# Patient Record
Sex: Female | Born: 1975
Health system: Southern US, Community
[De-identification: ages and names within clinical notes are randomized; demographics above are authoritative.]

## PROBLEM LIST (undated history)

## (undated) ENCOUNTER — Inpatient Hospital Stay (HOSPITAL_COMMUNITY): Payer: Self-pay

## (undated) DIAGNOSIS — Z8759 Personal history of other complications of pregnancy, childbirth and the puerperium: Secondary | ICD-10-CM

## (undated) DIAGNOSIS — E663 Overweight: Secondary | ICD-10-CM

## (undated) DIAGNOSIS — F418 Other specified anxiety disorders: Secondary | ICD-10-CM

## (undated) DIAGNOSIS — G4700 Insomnia, unspecified: Secondary | ICD-10-CM

## (undated) DIAGNOSIS — J069 Acute upper respiratory infection, unspecified: Secondary | ICD-10-CM

## (undated) DIAGNOSIS — R112 Nausea with vomiting, unspecified: Secondary | ICD-10-CM

## (undated) DIAGNOSIS — Z0000 Encounter for general adult medical examination without abnormal findings: Principal | ICD-10-CM

## (undated) DIAGNOSIS — Z8709 Personal history of other diseases of the respiratory system: Secondary | ICD-10-CM

## (undated) DIAGNOSIS — B351 Tinea unguium: Secondary | ICD-10-CM

## (undated) DIAGNOSIS — B019 Varicella without complication: Secondary | ICD-10-CM

## (undated) DIAGNOSIS — Z9889 Other specified postprocedural states: Secondary | ICD-10-CM

## (undated) DIAGNOSIS — T7840XA Allergy, unspecified, initial encounter: Secondary | ICD-10-CM

## (undated) DIAGNOSIS — Z8619 Personal history of other infectious and parasitic diseases: Secondary | ICD-10-CM

## (undated) DIAGNOSIS — K589 Irritable bowel syndrome without diarrhea: Secondary | ICD-10-CM

## (undated) HISTORY — DX: Personal history of other complications of pregnancy, childbirth and the puerperium: Z87.59

## (undated) HISTORY — DX: Irritable bowel syndrome without diarrhea: K58.9

## (undated) HISTORY — DX: Varicella without complication: B01.9

## (undated) HISTORY — DX: Allergy, unspecified, initial encounter: T78.40XA

## (undated) HISTORY — DX: Personal history of other diseases of the respiratory system: Z87.09

## (undated) HISTORY — DX: Other specified anxiety disorders: F41.8

## (undated) HISTORY — DX: Encounter for general adult medical examination without abnormal findings: Z00.00

## (undated) HISTORY — PX: CLEFT PALATE REPAIR: SUR1165

## (undated) HISTORY — DX: Tinea unguium: B35.1

## (undated) HISTORY — DX: Personal history of other infectious and parasitic diseases: Z86.19

## (undated) HISTORY — DX: Insomnia, unspecified: G47.00

## (undated) HISTORY — DX: Acute upper respiratory infection, unspecified: J06.9

## (undated) HISTORY — PX: OTHER SURGICAL HISTORY: SHX169

## (undated) HISTORY — DX: Overweight: E66.3

---

## 2003-08-08 ENCOUNTER — Other Ambulatory Visit: Admission: RE | Admit: 2003-08-08 | Discharge: 2003-08-08 | Payer: Self-pay | Admitting: Obstetrics & Gynecology

## 2003-11-07 ENCOUNTER — Encounter: Admission: RE | Admit: 2003-11-07 | Discharge: 2003-11-07 | Payer: Self-pay | Admitting: Family Medicine

## 2004-04-07 ENCOUNTER — Emergency Department (HOSPITAL_COMMUNITY): Admission: EM | Admit: 2004-04-07 | Discharge: 2004-04-07 | Payer: Self-pay | Admitting: Emergency Medicine

## 2004-10-30 ENCOUNTER — Ambulatory Visit: Payer: Self-pay | Admitting: Sports Medicine

## 2006-10-19 ENCOUNTER — Inpatient Hospital Stay (HOSPITAL_COMMUNITY): Admission: AD | Admit: 2006-10-19 | Discharge: 2006-10-22 | Payer: Self-pay | Admitting: Obstetrics & Gynecology

## 2006-11-03 ENCOUNTER — Ambulatory Visit: Admission: RE | Admit: 2006-11-03 | Discharge: 2006-11-03 | Payer: Self-pay | Admitting: Obstetrics & Gynecology

## 2006-11-17 HISTORY — PX: MANDIBLE FRACTURE SURGERY: SHX706

## 2007-12-02 ENCOUNTER — Emergency Department (HOSPITAL_COMMUNITY): Admission: EM | Admit: 2007-12-02 | Discharge: 2007-12-02 | Payer: Self-pay | Admitting: Family Medicine

## 2008-09-14 ENCOUNTER — Inpatient Hospital Stay (HOSPITAL_COMMUNITY): Admission: AD | Admit: 2008-09-14 | Discharge: 2008-09-14 | Payer: Self-pay | Admitting: Obstetrics

## 2008-09-18 ENCOUNTER — Inpatient Hospital Stay (HOSPITAL_COMMUNITY): Admission: AD | Admit: 2008-09-18 | Discharge: 2008-09-19 | Payer: Self-pay | Admitting: Obstetrics

## 2010-01-20 ENCOUNTER — Emergency Department (HOSPITAL_COMMUNITY)
Admission: EM | Admit: 2010-01-20 | Discharge: 2010-01-20 | Payer: Self-pay | Source: Home / Self Care | Admitting: Emergency Medicine

## 2010-09-02 ENCOUNTER — Encounter
Admission: RE | Admit: 2010-09-02 | Discharge: 2010-12-01 | Payer: Self-pay | Source: Home / Self Care | Attending: Obstetrics & Gynecology | Admitting: Obstetrics & Gynecology

## 2010-11-05 ENCOUNTER — Inpatient Hospital Stay (HOSPITAL_COMMUNITY)
Admission: RE | Admit: 2010-11-05 | Discharge: 2010-11-07 | Payer: Self-pay | Source: Home / Self Care | Attending: Obstetrics & Gynecology | Admitting: Obstetrics & Gynecology

## 2010-11-17 LAB — HM PAP SMEAR: HM Pap smear: NORMAL

## 2011-01-27 LAB — CBC
HCT: 35.3 % — ABNORMAL LOW (ref 36.0–46.0)
HCT: 38.8 % (ref 36.0–46.0)
Hemoglobin: 11.6 g/dL — ABNORMAL LOW (ref 12.0–15.0)
Hemoglobin: 13 g/dL (ref 12.0–15.0)
MCH: 30.1 pg (ref 26.0–34.0)
MCH: 30.4 pg (ref 26.0–34.0)
MCHC: 32.9 g/dL (ref 30.0–36.0)
MCHC: 33.5 g/dL (ref 30.0–36.0)
MCV: 90.9 fL (ref 78.0–100.0)
MCV: 91.5 fL (ref 78.0–100.0)
Platelets: 169 10*3/uL (ref 150–400)
Platelets: 201 10*3/uL (ref 150–400)
RBC: 3.86 MIL/uL — ABNORMAL LOW (ref 3.87–5.11)
RBC: 4.27 MIL/uL (ref 3.87–5.11)
RDW: 13.9 % (ref 11.5–15.5)
RDW: 14.1 % (ref 11.5–15.5)
WBC: 12.8 10*3/uL — ABNORMAL HIGH (ref 4.0–10.5)
WBC: 9 10*3/uL (ref 4.0–10.5)

## 2011-01-27 LAB — ABO/RH: ABO/RH(D): O POS

## 2011-01-27 LAB — RPR: RPR Ser Ql: NONREACTIVE

## 2011-02-09 LAB — POCT RAPID STREP A (OFFICE): Streptococcus, Group A Screen (Direct): NEGATIVE

## 2011-08-08 LAB — POCT RAPID STREP A: Streptococcus, Group A Screen (Direct): NEGATIVE

## 2011-08-19 LAB — CBC
HCT: 34.4 — ABNORMAL LOW
HCT: 42.6
Hemoglobin: 11.5 — ABNORMAL LOW
Hemoglobin: 14.1
MCHC: 33.2
MCHC: 33.4
MCV: 91.1
MCV: 92
Platelets: 229
Platelets: 268
RBC: 3.74 — ABNORMAL LOW
RBC: 4.68
RDW: 13.9
RDW: 13.9
WBC: 12.6 — ABNORMAL HIGH
WBC: 13.8 — ABNORMAL HIGH

## 2011-08-19 LAB — CCBB MATERNAL DONOR DRAW

## 2011-08-19 LAB — RPR: RPR Ser Ql: NONREACTIVE

## 2012-02-02 ENCOUNTER — Ambulatory Visit (INDEPENDENT_AMBULATORY_CARE_PROVIDER_SITE_OTHER): Payer: 59 | Admitting: Family Medicine

## 2012-02-02 ENCOUNTER — Encounter: Payer: Self-pay | Admitting: Family Medicine

## 2012-02-02 VITALS — BP 108/74 | HR 105 | Temp 98.2°F | Ht 63.75 in | Wt 138.4 lb

## 2012-02-02 DIAGNOSIS — Z309 Encounter for contraceptive management, unspecified: Secondary | ICD-10-CM

## 2012-02-02 DIAGNOSIS — Z0000 Encounter for general adult medical examination without abnormal findings: Secondary | ICD-10-CM | POA: Insufficient documentation

## 2012-02-02 DIAGNOSIS — G4700 Insomnia, unspecified: Secondary | ICD-10-CM

## 2012-02-02 DIAGNOSIS — R32 Unspecified urinary incontinence: Secondary | ICD-10-CM | POA: Insufficient documentation

## 2012-02-02 DIAGNOSIS — T7840XA Allergy, unspecified, initial encounter: Secondary | ICD-10-CM

## 2012-02-02 DIAGNOSIS — Z8619 Personal history of other infectious and parasitic diseases: Secondary | ICD-10-CM | POA: Insufficient documentation

## 2012-02-02 DIAGNOSIS — Z8709 Personal history of other diseases of the respiratory system: Secondary | ICD-10-CM | POA: Insufficient documentation

## 2012-02-02 DIAGNOSIS — K589 Irritable bowel syndrome without diarrhea: Secondary | ICD-10-CM

## 2012-02-02 HISTORY — DX: Irritable bowel syndrome, unspecified: K58.9

## 2012-02-02 HISTORY — DX: Encounter for general adult medical examination without abnormal findings: Z00.00

## 2012-02-02 HISTORY — DX: Insomnia, unspecified: G47.00

## 2012-02-02 MED ORDER — LEVONORGESTREL 20 MCG/24HR IU IUD: 1.0000 | INTRAUTERINE_SYSTEM | Freq: Once | INTRAUTERINE | Status: DC

## 2012-02-02 NOTE — Assessment & Plan Note (Signed)
Encouraged Kegel exercises bid, call if worsens or does not improve

## 2012-02-02 NOTE — Assessment & Plan Note (Signed)
Given advice on good sleep hygiene, avoid blue lights, cool, dark room. May use Diphenhydramine prn

## 2012-02-02 NOTE — Patient Instructions (Signed)
Preventive Care for Adults, Female A healthy lifestyle and preventive care can promote health and wellness. Preventive health guidelines for women include the following key practices.  A routine yearly physical is a good way to check with your caregiver about your health and preventive screening. It is a chance to share any concerns and updates on your health, and to receive a thorough exam.   Visit your dentist for a routine exam and preventive care every 6 months. Brush your teeth twice a day and floss once a day. Good oral hygiene prevents tooth decay and gum disease.   The frequency of eye exams is based on your age, health, family medical history, use of contact lenses, and other factors. Follow your caregiver's recommendations for frequency of eye exams.   Eat a healthy diet. Foods like vegetables, fruits, whole grains, low-fat dairy products, and lean protein foods contain the nutrients you need without too many calories. Decrease your intake of foods high in solid fats, added sugars, and salt. Eat the right amount of calories for you.Get information about a proper diet from your caregiver, if necessary.   Regular physical exercise is one of the most important things you can do for your health. Most adults should get at least 150 minutes of moderate-intensity exercise (any activity that increases your heart rate and causes you to sweat) each week. In addition, most adults need muscle-strengthening exercises on 2 or more days a week.   Maintain a healthy weight. The body mass index (BMI) is a screening tool to identify possible weight problems. It provides an estimate of body fat based on height and weight. Your caregiver can help determine your BMI, and can help you achieve or maintain a healthy weight.For adults 20 years and older:   A BMI below 18.5 is considered underweight.   A BMI of 18.5 to 24.9 is normal.   A BMI of 25 to 29.9 is considered overweight.   A BMI of 30 and above is  considered obese.   Maintain normal blood lipids and cholesterol levels by exercising and minimizing your intake of saturated fat. Eat a balanced diet with plenty of fruit and vegetables. Blood tests for lipids and cholesterol should begin at age 20 and be repeated every 5 years. If your lipid or cholesterol levels are high, you are over 50, or you are at high risk for heart disease, you may need your cholesterol levels checked more frequently.Ongoing high lipid and cholesterol levels should be treated with medicines if diet and exercise are not effective.   If you smoke, find out from your caregiver how to quit. If you do not use tobacco, do not start.   If you are pregnant, do not drink alcohol. If you are breastfeeding, be very cautious about drinking alcohol. If you are not pregnant and choose to drink alcohol, do not exceed 1 drink per day. One drink is considered to be 12 ounces (355 mL) of beer, 5 ounces (148 mL) of wine, or 1.5 ounces (44 mL) of liquor.   Avoid use of street drugs. Do not share needles with anyone. Ask for help if you need support or instructions about stopping the use of drugs.   High blood pressure causes heart disease and increases the risk of stroke. Your blood pressure should be checked at least every 1 to 2 years. Ongoing high blood pressure should be treated with medicines if weight loss and exercise are not effective.   If you are 55 to 36   years old, ask your caregiver if you should take aspirin to prevent strokes.   Diabetes screening involves taking a blood sample to check your fasting blood sugar level. This should be done once every 3 years, after age 45, if you are within normal weight and without risk factors for diabetes. Testing should be considered at a younger age or be carried out more frequently if you are overweight and have at least 1 risk factor for diabetes.   Breast cancer screening is essential preventive care for women. You should practice "breast  self-awareness." This means understanding the normal appearance and feel of your breasts and may include breast self-examination. Any changes detected, no matter how small, should be reported to a caregiver. Women in their 20s and 30s should have a clinical breast exam (CBE) by a caregiver as part of a regular health exam every 1 to 3 years. After age 40, women should have a CBE every year. Starting at age 40, women should consider having a mammography (breast X-ray test) every year. Women who have a family history of breast cancer should talk to their caregiver about genetic screening. Women at a high risk of breast cancer should talk to their caregivers about having magnetic resonance imaging (MRI) and a mammography every year.   The Pap test is a screening test for cervical cancer. A Pap test can show cell changes on the cervix that might become cervical cancer if left untreated. A Pap test is a procedure in which cells are obtained and examined from the lower end of the uterus (cervix).   Women should have a Pap test starting at age 21.   Between ages 21 and 29, Pap tests should be repeated every 2 years.   Beginning at age 30, you should have a Pap test every 3 years as long as the past 3 Pap tests have been normal.   Some women have medical problems that increase the chance of getting cervical cancer. Talk to your caregiver about these problems. It is especially important to talk to your caregiver if a new problem develops soon after your last Pap test. In these cases, your caregiver may recommend more frequent screening and Pap tests.   The above recommendations are the same for women who have or have not gotten the vaccine for human papillomavirus (HPV).   If you had a hysterectomy for a problem that was not cancer or a condition that could lead to cancer, then you no longer need Pap tests. Even if you no longer need a Pap test, a regular exam is a good idea to make sure no other problems are  starting.   If you are between ages 65 and 70, and you have had normal Pap tests going back 10 years, you no longer need Pap tests. Even if you no longer need a Pap test, a regular exam is a good idea to make sure no other problems are starting.   If you have had past treatment for cervical cancer or a condition that could lead to cancer, you need Pap tests and screening for cancer for at least 20 years after your treatment.   If Pap tests have been discontinued, risk factors (such as a new sexual partner) need to be reassessed to determine if screening should be resumed.   The HPV test is an additional test that may be used for cervical cancer screening. The HPV test looks for the virus that can cause the cell changes on the cervix.   The cells collected during the Pap test can be tested for HPV. The HPV test could be used to screen women aged 30 years and older, and should be used in women of any age who have unclear Pap test results. After the age of 30, women should have HPV testing at the same frequency as a Pap test.   Colorectal cancer can be detected and often prevented. Most routine colorectal cancer screening begins at the age of 50 and continues through age 75. However, your caregiver may recommend screening at an earlier age if you have risk factors for colon cancer. On a yearly basis, your caregiver may provide home test kits to check for hidden blood in the stool. Use of a small camera at the end of a tube, to directly examine the colon (sigmoidoscopy or colonoscopy), can detect the earliest forms of colorectal cancer. Talk to your caregiver about this at age 50, when routine screening begins. Direct examination of the colon should be repeated every 5 to 10 years through age 75, unless early forms of pre-cancerous polyps or small growths are found.   Hepatitis C blood testing is recommended for all people born from 1945 through 1965 and any individual with known risks for hepatitis C.    Practice safe sex. Use condoms and avoid high-risk sexual practices to reduce the spread of sexually transmitted infections (STIs). STIs include gonorrhea, chlamydia, syphilis, trichomonas, herpes, HPV, and human immunodeficiency virus (HIV). Herpes, HIV, and HPV are viral illnesses that have no cure. They can result in disability, cancer, and death. Sexually active women aged 25 and younger should be checked for chlamydia. Older women with new or multiple partners should also be tested for chlamydia. Testing for other STIs is recommended if you are sexually active and at increased risk.   Osteoporosis is a disease in which the bones lose minerals and strength with aging. This can result in serious bone fractures. The risk of osteoporosis can be identified using a bone density scan. Women ages 65 and over and women at risk for fractures or osteoporosis should discuss screening with their caregivers. Ask your caregiver whether you should take a calcium supplement or vitamin D to reduce the rate of osteoporosis.   Menopause can be associated with physical symptoms and risks. Hormone replacement therapy is available to decrease symptoms and risks. You should talk to your caregiver about whether hormone replacement therapy is right for you.   Use sunscreen with sun protection factor (SPF) of 30 or more. Apply sunscreen liberally and repeatedly throughout the day. You should seek shade when your shadow is shorter than you. Protect yourself by wearing long sleeves, pants, a wide-brimmed hat, and sunglasses year round, whenever you are outdoors.   Once a month, do a whole body skin exam, using a mirror to look at the skin on your back. Notify your caregiver of new moles, moles that have irregular borders, moles that are larger than a pencil eraser, or moles that have changed in shape or color.   Stay current with required immunizations.   Influenza. You need a dose every fall (or winter). The composition of  the flu vaccine changes each year, so being vaccinated once is not enough.   Pneumococcal polysaccharide. You need 1 to 2 doses if you smoke cigarettes or if you have certain chronic medical conditions. You need 1 dose at age 65 (or older) if you have never been vaccinated.   Tetanus, diphtheria, pertussis (Tdap, Td). Get 1 dose of   Tdap vaccine if you are younger than age 65, are over 65 and have contact with an infant, are a healthcare worker, are pregnant, or simply want to be protected from whooping cough. After that, you need a Td booster dose every 10 years. Consult your caregiver if you have not had at least 3 tetanus and diphtheria-containing shots sometime in your life or have a deep or dirty wound.   HPV. You need this vaccine if you are a woman age 26 or younger. The vaccine is given in 3 doses over 6 months.   Measles, mumps, rubella (MMR). You need at least 1 dose of MMR if you were born in 1957 or later. You may also need a second dose.   Meningococcal. If you are age 19 to 21 and a first-year college student living in a residence hall, or have one of several medical conditions, you need to get vaccinated against meningococcal disease. You may also need additional booster doses.   Zoster (shingles). If you are age 60 or older, you should get this vaccine.   Varicella (chickenpox). If you have never had chickenpox or you were vaccinated but received only 1 dose, talk to your caregiver to find out if you need this vaccine.   Hepatitis A. You need this vaccine if you have a specific risk factor for hepatitis A virus infection or you simply wish to be protected from this disease. The vaccine is usually given as 2 doses, 6 to 18 months apart.   Hepatitis B. You need this vaccine if you have a specific risk factor for hepatitis B virus infection or you simply wish to be protected from this disease. The vaccine is given in 3 doses, usually over 6 months.  Preventive Services /  Frequency Ages 19 to 39  Blood pressure check.** / Every 1 to 2 years.   Lipid and cholesterol check.** / Every 5 years beginning at age 20.   Clinical breast exam.** / Every 3 years for women in their 20s and 30s.   Pap test.** / Every 2 years from ages 21 through 29. Every 3 years starting at age 30 through age 65 or 70 with a history of 3 consecutive normal Pap tests.   HPV screening.** / Every 3 years from ages 30 through ages 65 to 70 with a history of 3 consecutive normal Pap tests.   Hepatitis C blood test.** / For any individual with known risks for hepatitis C.   Skin self-exam. / Monthly.   Influenza immunization.** / Every year.   Pneumococcal polysaccharide immunization.** / 1 to 2 doses if you smoke cigarettes or if you have certain chronic medical conditions.   Tetanus, diphtheria, pertussis (Tdap, Td) immunization. / A one-time dose of Tdap vaccine. After that, you need a Td booster dose every 10 years.   HPV immunization. / 3 doses over 6 months, if you are 26 and younger.   Measles, mumps, rubella (MMR) immunization. / You need at least 1 dose of MMR if you were born in 1957 or later. You may also need a second dose.   Meningococcal immunization. / 1 dose if you are age 19 to 21 and a first-year college student living in a residence hall, or have one of several medical conditions, you need to get vaccinated against meningococcal disease. You may also need additional booster doses.   Varicella immunization.** / Consult your caregiver.   Hepatitis A immunization.** / Consult your caregiver. 2 doses, 6 to 18 months   apart.   Hepatitis B immunization.** / Consult your caregiver. 3 doses usually over 6 months.  Ages 40 to 64  Blood pressure check.** / Every 1 to 2 years.   Lipid and cholesterol check.** / Every 5 years beginning at age 20.   Clinical breast exam.** / Every year after age 40.   Mammogram.** / Every year beginning at age 40 and continuing for as  long as you are in good health. Consult with your caregiver.   Pap test.** / Every 3 years starting at age 30 through age 65 or 70 with a history of 3 consecutive normal Pap tests.   HPV screening.** / Every 3 years from ages 30 through ages 65 to 70 with a history of 3 consecutive normal Pap tests.   Fecal occult blood test (FOBT) of stool. / Every year beginning at age 50 and continuing until age 75. You may not need to do this test if you get a colonoscopy every 10 years.   Flexible sigmoidoscopy or colonoscopy.** / Every 5 years for a flexible sigmoidoscopy or every 10 years for a colonoscopy beginning at age 50 and continuing until age 75.   Hepatitis C blood test.** / For all people born from 1945 through 1965 and any individual with known risks for hepatitis C.   Skin self-exam. / Monthly.   Influenza immunization.** / Every year.   Pneumococcal polysaccharide immunization.** / 1 to 2 doses if you smoke cigarettes or if you have certain chronic medical conditions.   Tetanus, diphtheria, pertussis (Tdap, Td) immunization.** / A one-time dose of Tdap vaccine. After that, you need a Td booster dose every 10 years.   Measles, mumps, rubella (MMR) immunization. / You need at least 1 dose of MMR if you were born in 1957 or later. You may also need a second dose.   Varicella immunization.** / Consult your caregiver.   Meningococcal immunization.** / Consult your caregiver.   Hepatitis A immunization.** / Consult your caregiver. 2 doses, 6 to 18 months apart.   Hepatitis B immunization.** / Consult your caregiver. 3 doses, usually over 6 months.  Ages 65 and over  Blood pressure check.** / Every 1 to 2 years.   Lipid and cholesterol check.** / Every 5 years beginning at age 20.   Clinical breast exam.** / Every year after age 40.   Mammogram.** / Every year beginning at age 40 and continuing for as long as you are in good health. Consult with your caregiver.   Pap test.** /  Every 3 years starting at age 30 through age 65 or 70 with a 3 consecutive normal Pap tests. Testing can be stopped between 65 and 70 with 3 consecutive normal Pap tests and no abnormal Pap or HPV tests in the past 10 years.   HPV screening.** / Every 3 years from ages 30 through ages 65 or 70 with a history of 3 consecutive normal Pap tests. Testing can be stopped between 65 and 70 with 3 consecutive normal Pap tests and no abnormal Pap or HPV tests in the past 10 years.   Fecal occult blood test (FOBT) of stool. / Every year beginning at age 50 and continuing until age 75. You may not need to do this test if you get a colonoscopy every 10 years.   Flexible sigmoidoscopy or colonoscopy.** / Every 5 years for a flexible sigmoidoscopy or every 10 years for a colonoscopy beginning at age 50 and continuing until age 75.   Hepatitis   C blood test.** / For all people born from [birth year] through 1965 and any individual with known risks for hepatitis C.   Osteoporosis screening.** / A one-time screening for women ages 52 and over and women at risk for fractures or osteoporosis.   Skin self-exam. / Monthly.   Influenza immunization.** / Every year.   Pneumococcal polysaccharide immunization.** / 1 dose at age 65 (or older) if you have never been vaccinated.   Tetanus, diphtheria, pertussis (Tdap, Td) immunization. / A one-time dose of Tdap vaccine if you are over 65 and have contact with an infant, are a Research scientist (physical sciences), or simply want to be protected from whooping cough. After that, you need a Td booster dose every 10 years.   Varicella immunization.** / Consult your caregiver.   Meningococcal immunization.** / Consult your caregiver.   Hepatitis A immunization.** / Consult your caregiver. 2 doses, 6 to 18 months apart.   Hepatitis B immunization.** / Check with your caregiver. 3 doses, usually over 6 months.  ** Family history and personal history of risk and conditions may change your caregiver's  recommendations. Document Released: 12/30/2001 Document Revised: 10/23/2011 Document Reviewed: 03/31/2011 [facility] Patient Information 2012 [place], [place].  Start a probiotic such as Librarian, academic daily and a yogurt daily, such as [employer] yogurts  Consider a fiber supplement daily as well MegaRed Krill oil caps by [employer] 1 daily is good for cholesterol but many other things such as Ne[employer] Health

## 2012-02-02 NOTE — Progress Notes (Signed)
Patient ID: RESA RINKS, female   DOB: June 10, 1976, 36 y.o.   MRN: [id number] IDALIZ TINKLE [id number] 01/04/1976 02/02/2012      Progress Note New Patient  Subjective  Chief Complaint  Chief Complaint  Patient presents with  . Establish Care    new patient    HPI  This is a 36 year old Caucasian female who is in today for new patient appointment. Historically her biggest health concerns are all related. She has shoulder with alternate diarrhea and constipation off and on for years. She was seen by gastroenterologist about 2 years ago and diagnosed with irritable bowel syndrome. Was given a prescription for Bentyl but never took it secondary to becoming pregnant. She moves her bowels comfortably once to twice daily but occasionally has days of 6 use of multiple stools daily. No bloody or tarry stool. No urinary complaints. He does not take anything for these symptoms except for an occasional half an Imodium. Sometimes when she does that she'll in a constipated for a few days so she tries to avoid doing this. She otherwise feels well today. Does occasionally have trouble falling asleep but does have 3 young children at home. The child had trouble with sinus infections but these have not bothered her and as an adult. They've improved since she had septum repair. She does occasionally get itchy runny nose and believes she still has low-grade seasonal allergies but finds the symptoms tolerable. At present she still follows with OB/GYN and is considering a fourth pregnancy.  Past Medical History  Diagnosis Date  . Chicken pox as a child  . Vaginal delivery     X 3  . H/O sinusitis     improved s/p surgical repair of deviated septum  . Urinary incontinence     s/p 3rd vaginal delivery, occasional  . Preventative health care 02/02/2012  . Insomnia 02/02/2012  . IBS (irritable bowel syndrome) 02/02/2012  . Allergy     seasonal    Past Surgical History  Procedure Date  . Mandible  fracture surgery 2008  . Deviated septum repair teenager  . Cleft palate repair as a child  . Skin biopsy     multiple all benign    Family History  Problem Relation Age of Onset  . Hypertension Mother   . Hyperlipidemia Mother   . Diabetes Father     type 2  . Stroke Maternal Grandmother     few  . Cancer Maternal Grandfather     stomach  . Heart attack Maternal Grandfather     few  . Cancer Paternal Grandfather     lung- nonsmoker    History   Social History  . Marital Status: Married    Spouse Name: N/A    Number of Children: N/A  . Years of Education: N/A   Occupational History  . Not on file.   Social History Main Topics  . Smoking status: Never Smoker   . Smokeless tobacco: Never Used  . Alcohol Use: Yes     socially  . Drug Use: No  . Sexually Active: Yes -- Female partner(s)   Other Topics Concern  . Not on file   Social History Narrative  . No narrative on file    No current outpatient prescriptions on file prior to visit.    No Known Allergies  Review of Systems  Review of Systems  Constitutional: Negative for fever, chills and malaise/fatigue.  HENT: Negative for hearing loss, nosebleeds and congestion.  Eyes: Negative for discharge.  Respiratory: Negative for cough, sputum production, shortness of breath and wheezing.   Cardiovascular: Negative for chest pain, palpitations and leg swelling.  Gastrointestinal: Positive for diarrhea. Negative for heartburn, nausea, vomiting, abdominal pain, constipation and blood in stool.  Genitourinary: Negative for dysuria, urgency, frequency and hematuria.  Musculoskeletal: Negative for myalgias, back pain and falls.  Skin: Negative for rash.  Neurological: Negative for dizziness, tremors, sensory change, focal weakness, loss of consciousness, weakness and headaches.  Endo/Heme/Allergies: Negative for polydipsia. Does not bruise/bleed easily.  Psychiatric/Behavioral: Negative for depression and suicidal  ideas. The patient has insomnia. The patient is not nervous/anxious.     Objective  BP 108/74  Pulse 105  Temp(Src) 98.2 F (36.8 C) (Temporal)  Ht 5' 3.75" (1.619 m)  Wt 138 lb 6.4 oz (62.778 kg)  BMI 23.94 kg/m2  SpO2 97%  Physical Exam  Physical Exam  Constitutional: She is oriented to person, place, and time and well-developed, well-nourished, and in no distress. No distress.  HENT:  Head: Normocephalic and atraumatic.  Right Ear: External ear normal.  Left Ear: External ear normal.  Nose: Nose normal.  Mouth/Throat: Oropharynx is clear and moist. No oropharyngeal exudate.  Eyes: Conjunctivae are normal. Pupils are equal, round, and reactive to light. Right eye exhibits no discharge. Left eye exhibits no discharge. No scleral icterus.  Neck: Normal range of motion. Neck supple. No thyromegaly present.  Cardiovascular: Normal rate, regular rhythm, normal heart sounds and intact distal pulses.   No murmur heard. Pulmonary/Chest: Effort normal and breath sounds normal. No respiratory distress. She has no wheezes. She has no rales.  Abdominal: Soft. Bowel sounds are normal. She exhibits no distension and no mass. There is no tenderness.  Musculoskeletal: Normal range of motion. She exhibits no edema and no tenderness.  Lymphadenopathy:    She has no cervical adenopathy.  Neurological: She is alert and oriented to person, place, and time. She has normal reflexes. No cranial nerve deficit. Coordination normal.  Skin: Skin is warm and dry. No rash noted. She is not diaphoretic.  Psychiatric: Mood, memory and affect normal.       Assessment & Plan  Preventative health care Patient encouraged to eat a heart healthy diet, consider the DASH diet with decreased processed food. Needs 8 hours sleep and hi fiber foods. Is going to have her fasting labs done with her OB/GYN and forwarded to [place] this year.  Urinary incontinence Encouraged Kegel exercises bid, call if worsens or does  not improve  Insomnia Given advice on good sleep hygiene, avoid blue lights, cool, dark room. May use Diphenhydramine prn  IBS (irritable bowel syndrome) Encouraged to avoid processed food, add a fiber supplement, add a probiotic and a yogurt daily. Notify us if symptoms worsen or do not resolve. Would consider Bentyl or just Hyoscyamine prn for discomfort if symptoms worsen  Allergic state Mild, does not usually take meds, may consider Zyrtec prn.

## 2012-02-02 NOTE — Assessment & Plan Note (Addendum)
Patient encouraged to eat a heart healthy diet, consider the DASH diet with decreased processed food. Needs 8 hours sleep and hi fiber foods. Is going to have her fasting labs done with her OB/GYN and forwarded to [place] this year.

## 2012-02-02 NOTE — Assessment & Plan Note (Signed)
Encouraged to avoid processed food, add a fiber supplement, add a probiotic and a yogurt daily. Notify us if symptoms worsen or do not resolve. Would consider Bentyl or just Hyoscyamine prn for discomfort if symptoms worsen

## 2012-02-02 NOTE — Assessment & Plan Note (Signed)
Mild, does not usually take meds, may consider Zyrtec prn.

## 2012-10-11 ENCOUNTER — Ambulatory Visit: Payer: 59 | Attending: General Practice | Admitting: Physical Therapy

## 2012-10-11 DIAGNOSIS — IMO0001 Reserved for inherently not codable concepts without codable children: Secondary | ICD-10-CM | POA: Insufficient documentation

## 2012-10-11 DIAGNOSIS — M2550 Pain in unspecified joint: Secondary | ICD-10-CM | POA: Insufficient documentation

## 2012-10-11 DIAGNOSIS — M2569 Stiffness of other specified joint, not elsewhere classified: Secondary | ICD-10-CM | POA: Insufficient documentation

## 2012-10-19 ENCOUNTER — Ambulatory Visit: Payer: 59 | Attending: General Practice | Admitting: Physical Therapy

## 2012-10-19 DIAGNOSIS — IMO0001 Reserved for inherently not codable concepts without codable children: Secondary | ICD-10-CM | POA: Insufficient documentation

## 2012-10-19 DIAGNOSIS — M2550 Pain in unspecified joint: Secondary | ICD-10-CM | POA: Insufficient documentation

## 2012-10-19 DIAGNOSIS — M2569 Stiffness of other specified joint, not elsewhere classified: Secondary | ICD-10-CM | POA: Insufficient documentation

## 2012-10-22 ENCOUNTER — Ambulatory Visit: Payer: 59 | Admitting: Physical Therapy

## 2012-10-26 ENCOUNTER — Ambulatory Visit: Payer: 59 | Admitting: Physical Therapy

## 2012-10-29 ENCOUNTER — Ambulatory Visit: Payer: 59 | Admitting: Physical Therapy

## 2012-11-02 ENCOUNTER — Ambulatory Visit: Payer: 59 | Admitting: Physical Therapy

## 2012-11-05 ENCOUNTER — Ambulatory Visit: Payer: 59 | Admitting: Physical Therapy

## 2012-11-09 ENCOUNTER — Ambulatory Visit: Payer: 59 | Admitting: Physical Therapy

## 2012-11-12 ENCOUNTER — Ambulatory Visit: Payer: 59 | Admitting: Physical Therapy

## 2012-11-15 ENCOUNTER — Ambulatory Visit: Payer: 59 | Admitting: Physical Therapy

## 2012-11-22 ENCOUNTER — Ambulatory Visit: Payer: 59 | Attending: General Practice | Admitting: Physical Therapy

## 2012-11-22 DIAGNOSIS — M2550 Pain in unspecified joint: Secondary | ICD-10-CM | POA: Insufficient documentation

## 2012-11-22 DIAGNOSIS — M26629 Arthralgia of temporomandibular joint, unspecified side: Secondary | ICD-10-CM | POA: Insufficient documentation

## 2012-11-22 DIAGNOSIS — IMO0001 Reserved for inherently not codable concepts without codable children: Secondary | ICD-10-CM | POA: Insufficient documentation

## 2012-11-22 DIAGNOSIS — M2569 Stiffness of other specified joint, not elsewhere classified: Secondary | ICD-10-CM | POA: Insufficient documentation

## 2012-11-30 ENCOUNTER — Ambulatory Visit: Payer: 59 | Admitting: Physical Therapy

## 2012-12-06 ENCOUNTER — Ambulatory Visit: Payer: 59 | Admitting: Physical Therapy

## 2013-01-30 ENCOUNTER — Inpatient Hospital Stay (HOSPITAL_COMMUNITY): Payer: 59

## 2013-01-30 ENCOUNTER — Inpatient Hospital Stay (HOSPITAL_COMMUNITY)
Admission: AD | Admit: 2013-01-30 | Discharge: 2013-01-30 | Disposition: A | Discharge status: Home / Self Care | Payer: 59 | Source: Ambulatory Visit | Attending: Obstetrics & Gynecology | Admitting: Obstetrics & Gynecology

## 2013-01-30 ENCOUNTER — Encounter (HOSPITAL_COMMUNITY): Payer: Self-pay | Admitting: Obstetrics and Gynecology

## 2013-01-30 DIAGNOSIS — N949 Unspecified condition associated with female genital organs and menstrual cycle: Secondary | ICD-10-CM | POA: Insufficient documentation

## 2013-01-30 DIAGNOSIS — O00109 Unspecified tubal pregnancy without intrauterine pregnancy: Secondary | ICD-10-CM | POA: Insufficient documentation

## 2013-01-30 DIAGNOSIS — R112 Nausea with vomiting, unspecified: Secondary | ICD-10-CM | POA: Insufficient documentation

## 2013-01-30 DIAGNOSIS — N926 Irregular menstruation, unspecified: Secondary | ICD-10-CM | POA: Insufficient documentation

## 2013-01-30 DIAGNOSIS — O99891 Other specified diseases and conditions complicating pregnancy: Secondary | ICD-10-CM | POA: Insufficient documentation

## 2013-01-30 DIAGNOSIS — R197 Diarrhea, unspecified: Secondary | ICD-10-CM | POA: Insufficient documentation

## 2013-01-30 LAB — CBC WITH DIFFERENTIAL/PLATELET
Basophils Absolute: 0 10*3/uL (ref 0.0–0.1)
Basophils Relative: 0 % (ref 0–1)
Eosinophils Absolute: 0.2 10*3/uL (ref 0.0–0.7)
Eosinophils Relative: 2 % (ref 0–5)
HCT: 41.7 % (ref 36.0–46.0)
Hemoglobin: 14.5 g/dL (ref 12.0–15.0)
Lymphocytes Relative: 10 % — ABNORMAL LOW (ref 12–46)
Lymphs Abs: 1.2 10*3/uL (ref 0.7–4.0)
MCH: 31.4 pg (ref 26.0–34.0)
MCHC: 34.8 g/dL (ref 30.0–36.0)
MCV: 90.3 fL (ref 78.0–100.0)
Monocytes Absolute: 1 10*3/uL (ref 0.1–1.0)
Monocytes Relative: 9 % (ref 3–12)
Neutro Abs: 9.5 10*3/uL — ABNORMAL HIGH (ref 1.7–7.7)
Neutrophils Relative %: 79 % — ABNORMAL HIGH (ref 43–77)
Platelets: 313 10*3/uL (ref 150–400)
RBC: 4.62 MIL/uL (ref 3.87–5.11)
RDW: 12.4 % (ref 11.5–15.5)
WBC: 11.9 10*3/uL — ABNORMAL HIGH (ref 4.0–10.5)

## 2013-01-30 LAB — COMPREHENSIVE METABOLIC PANEL
ALT: 9 U/L (ref 0–35)
AST: 12 U/L (ref 0–37)
Albumin: 4 g/dL (ref 3.5–5.2)
Alkaline Phosphatase: 52 U/L (ref 39–117)
BUN: 8 mg/dL (ref 6–23)
CO2: 23 mEq/L (ref 19–32)
Calcium: 9.1 mg/dL (ref 8.4–10.5)
Chloride: 103 mEq/L (ref 96–112)
Creatinine, Ser: 0.57 mg/dL (ref 0.50–1.10)
GFR calc Af Amer: >90 mL/min (ref >90)
GFR calc non Af Amer: >90 mL/min (ref >90)
Glucose, Bld: 94 mg/dL (ref 70–99)
Potassium: 3.2 mEq/L — ABNORMAL LOW (ref 3.5–5.1)
Sodium: 139 mEq/L (ref 135–145)
Total Bilirubin: 1.3 mg/dL — ABNORMAL HIGH (ref 0.3–1.2)
Total Protein: 7.1 g/dL (ref 6.0–8.3)

## 2013-01-30 LAB — HCG, SERUM, QUALITATIVE: Preg, Serum: POSITIVE — AB

## 2013-01-30 LAB — HCG, QUANTITATIVE, PREGNANCY: hCG, Beta Chain, Quant, S: 448 m[IU]/mL — ABNORMAL HIGH (ref <5)

## 2013-01-30 MED ORDER — POTASSIUM CHLORIDE CRYS ER 10 MEQ PO TBCR
10.0000 meq | EXTENDED_RELEASE_TABLET | Freq: Once | ORAL | Status: AC
  Administered 2013-01-30: 10 meq via ORAL
  Filled 2013-01-30: qty 1

## 2013-01-30 MED ORDER — DIPHENOXYLATE-ATROPINE 2.5-0.025 MG PO TABS
2.0000 | ORAL_TABLET | Freq: Once | ORAL | Status: AC
  Administered 2013-01-30: 2 via ORAL
  Filled 2013-01-30: qty 2

## 2013-01-30 MED ORDER — PROMETHAZINE HCL 25 MG/ML IJ SOLN
12.5000 mg | Freq: Four times a day (QID) | INTRAMUSCULAR | Status: DC | PRN
  Administered 2013-01-30: 12.5 mg via INTRAVENOUS
  Filled 2013-01-30: qty 1

## 2013-01-30 MED ORDER — FAMOTIDINE IN NACL 20-0.9 MG/50ML-% IV SOLN
20.0000 mg | Freq: Once | INTRAVENOUS | Status: AC
  Administered 2013-01-30: 20 mg via INTRAVENOUS
  Filled 2013-01-30: qty 50

## 2013-01-30 MED ORDER — DEXTROSE 5 % IN LACTATED RINGERS IV BOLUS
500.0000 mL | Freq: Once | INTRAVENOUS | Status: AC
  Administered 2013-01-30: 1000 mL via INTRAVENOUS

## 2013-01-30 MED ORDER — PROMETHAZINE HCL 25 MG PO TABS: 25.0000 mg | ORAL_TABLET | Freq: Three times a day (TID) | ORAL | Status: DC | PRN

## 2013-01-30 MED ORDER — ONDANSETRON 8 MG/NS 50 ML IVPB
8.0000 mg | Freq: Once | INTRAVENOUS | Status: AC
  Administered 2013-01-30: 8 mg via INTRAVENOUS
  Filled 2013-01-30: qty 8

## 2013-01-30 MED ORDER — POTASSIUM CHLORIDE ER 10 MEQ PO TBCR: 10.0000 meq | EXTENDED_RELEASE_TABLET | Freq: Two times a day (BID) | ORAL | Status: DC

## 2013-01-30 MED ORDER — ONDANSETRON HCL 4 MG PO TABS: 4.0000 mg | ORAL_TABLET | Freq: Four times a day (QID) | ORAL | Status: DC

## 2013-01-30 MED ORDER — POTASSIUM CHLORIDE 10 MEQ/100ML IV SOLN
10.0000 meq | INTRAVENOUS | Status: DC
  Administered 2013-01-30: 10 meq via INTRAVENOUS
  Filled 2013-01-30 (×2): qty 100

## 2013-01-30 MED ORDER — ONDANSETRON HCL 4 MG/2ML IJ SOLN: 8.0000 mg | Freq: Once | INTRAMUSCULAR | Status: DC

## 2013-01-30 NOTE — MAU Note (Signed)
Pt states she has had nausea, vomiting, and diarrhea since Friday morning. States now that she is having a little brownish vaginal  Discharge. Pain on her left side that started within the last hour.

## 2013-01-30 NOTE — MAU Provider Note (Signed)
History   Patient has presented with severe nausea vomiting and and watery stool for past 3 days. [id number]. Last LMP 12/25/12 , irregular cycles and the patient has been daignosed pregnancy at office via SPT and watching  Serial Quants in the past week. Had brown spotting this morning associated with light sided adnexal pain.  CSN: [id number]  Arrival date and time: 01/30/13 0706   None     Chief Complaint  Patient presents with  . Nausea  . Emesis  . Diarrhea  . Vaginal Bleeding   HPI  OB History   Grav Para Term Preterm Abortions TAB SAB Ect Mult Living   5 3 2 1 1  0 1 0 0 3      Past Medical History  Diagnosis Date  . Chicken pox as a child  . Vaginal delivery     X 3  . H/O sinusitis     improved s/p surgical repair of deviated septum  . Urinary incontinence     s/p 3rd vaginal delivery, occasional  . Preventative health care 02/02/2012  . Insomnia 02/02/2012  . IBS (irritable bowel syndrome) 02/02/2012  . Allergy     seasonal    Past Surgical History  Procedure Laterality Date  . Mandible fracture surgery  2008  . Deviated septum repair  teenager  . Cleft palate repair  as a child  . Skin biopsy      multiple all benign    Family History  Problem Relation Age of Onset  . Hypertension Mother   . Hyperlipidemia Mother   . Diabetes Father     type 2  . Stroke Maternal Grandmother     few  . Cancer Maternal Grandfather     stomach  . Heart attack Maternal Grandfather     few  . Cancer Paternal Grandfather     lung- nonsmoker    History  Substance Use Topics  . Smoking status: Never Smoker   . Smokeless tobacco: Never Used  . Alcohol Use: Yes     Comment: socially    Allergies: No Known Allergies  Prescriptions prior to admission  Medication Sig Dispense Refill  . acetaminophen (TYLENOL) 500 MG tablet Take 500 mg by mouth every 6 (six) hours as needed for pain.      . diphenoxylate-atropine (LOMOTIL) 2.5-0.025 MG per tablet Take 2 tablets by  mouth 4 (four) times daily as needed for diarrhea or loose stools.      . ondansetron (ZOFRAN) 4 MG tablet Take 4 mg by mouth every 8 (eight) hours as needed for nausea.      . prenatal vitamin w/FE, FA (PRENATAL 1 + 1) 27-1 MG TABS Take 1 tablet by mouth daily at 12 noon.        Review of Systems  Constitutional: Positive for malaise/fatigue.  HENT: Negative.   Eyes: Negative.   Respiratory: Negative.   Cardiovascular: Negative.   Gastrointestinal: Positive for heartburn, vomiting and diarrhea.       Patient has now complains of  Lt sided  Adnexal pain associated with the commencement of brown spotting this am at 5.00hrs  SPT : positive and has had serial Quants in past week at office  Genitourinary: Negative.   Musculoskeletal: Negative.   Neurological: Positive for weakness.  Psychiatric/Behavioral: Negative.    Physical Exam   Blood pressure 105/67, pulse 94, temperature 97.3 F (36.3 C), resp. rate 18, height 5\' 4"  (1.626 m), weight 62.143 kg (137 lb), last menstrual period 12/25/2012.  Physical Exam  Constitutional: She is oriented to person, place, and time. She appears well-developed.  Appearance of dehydration due to severe nausea and vomiting with loose watery stool for past 48 hrs.  HENT:  Head: Normocephalic and atraumatic.  Left Ear: External ear normal.  Eyes: Conjunctivae and EOM are normal. Pupils are equal, round, and reactive to light.  Neck: Normal range of motion. Neck supple.  Cardiovascular: Normal rate, regular rhythm and normal heart sounds.   Respiratory: Effort normal and breath sounds normal.  GI: Soft. Bowel sounds are normal.  Genitourinary:  Commenced having brown vaginal spotting at 5.00 hrs this am associated with Lt adnexal pain Known SPT : positive with LMP 12/25/12. Serial Quants 01/25/13: 212, 01/28/13: 251 Quant pending  Today. Concern for possible IUP v's Ectopic Pregnancy.  Musculoskeletal: Normal range of motion.  Neurological: She is alert  and oriented to person, place, and time. She has normal reflexes.  Skin: Skin is warm and dry.  Psychiatric: She has a normal mood and affect.    MAU Course  Procedures  MDM IV fluid Hydration : D5/LR : bolus CBC with diff CMP Quant Zofran 8 mg IV x 1 dose Pepcid 20mg  IV x 1 dose Lomotil 2 tablets po x 1 dose Potassium Supplement as needed. Ob limited Ultrasound.  Assessment and Plan  IV Hydration Anti emetic therapy Quant:  Pending Collaboration with Dr. Juliene Pina on plan of care. Dr Juliene Pina will continue MAU care and discharge patient. Impression: No ectopic pregnancy, possible failed pregnancy F/U in office.  DAVIES, DENISE 01/30/2013, 7:44 AM   MD note- Pt examined. VS stable, abdomen soft. Pelvic - no CMT or adnexal mass or tenderness, brown discharge noted.  Patient with acute gastroenteritis over 48 hrs, dehydration, hypokalemia. IV hydration, Phenergan, Zofran, PO Imodium given. Tolerated small diet in MAU and diarrhea, vomiting much improved.  Didn't tolerate IV potassium rider, so she was given oral potassium that was tolerated.  bHCG - abnormal rise in office 250s on 3/13, today 448. Possible 5 wks, but irreg menses D/D possible early preg/ pregnancy failure/ ectopic pregnancy. Pelvic sono- no IUP or ectopic or pelvic mass or fluid.   D/c home.  Ectopic and SAB precautions, repeat quant HCG in office on 3/18.  N/V/diarrhea mngmt. BRAT diet. Rx Zofran, phenergan, Potassium bid x 5 days Office visit with Dr Seymour Bars in 48 hrs.

## 2013-02-23 ENCOUNTER — Encounter (HOSPITAL_COMMUNITY): Payer: Self-pay | Admitting: Obstetrics & Gynecology

## 2013-02-24 ENCOUNTER — Encounter (HOSPITAL_COMMUNITY)
Admission: RE | Disposition: A | Discharge status: Home / Self Care | Payer: Self-pay | Source: Ambulatory Visit | Attending: Obstetrics & Gynecology

## 2013-02-24 ENCOUNTER — Encounter (HOSPITAL_COMMUNITY): Payer: Self-pay | Admitting: Registered Nurse

## 2013-02-24 ENCOUNTER — Ambulatory Visit (HOSPITAL_COMMUNITY): Payer: 59

## 2013-02-24 ENCOUNTER — Ambulatory Visit (HOSPITAL_COMMUNITY)
Admission: RE | Admit: 2013-02-24 | Discharge: 2013-02-24 | Disposition: A | Discharge status: Home / Self Care | Payer: 59 | Source: Ambulatory Visit | Attending: Obstetrics & Gynecology | Admitting: Obstetrics & Gynecology

## 2013-02-24 ENCOUNTER — Ambulatory Visit (HOSPITAL_COMMUNITY): Payer: 59 | Admitting: Registered Nurse

## 2013-02-24 ENCOUNTER — Encounter (HOSPITAL_COMMUNITY): Payer: Self-pay | Admitting: Anesthesiology

## 2013-02-24 ENCOUNTER — Other Ambulatory Visit: Payer: Self-pay | Admitting: Obstetrics & Gynecology

## 2013-02-24 DIAGNOSIS — O034 Incomplete spontaneous abortion without complication: Secondary | ICD-10-CM | POA: Insufficient documentation

## 2013-02-24 HISTORY — PX: DILATION AND EVACUATION: SHX1459

## 2013-02-24 HISTORY — DX: Other specified postprocedural states: Z98.890

## 2013-02-24 HISTORY — DX: Other specified postprocedural states: R11.2

## 2013-02-24 LAB — CBC
HCT: 41.2 % (ref 36.0–46.0)
Hemoglobin: 14 g/dL (ref 12.0–15.0)
MCH: 31.3 pg (ref 26.0–34.0)
MCHC: 34 g/dL (ref 30.0–36.0)
MCV: 92.2 fL (ref 78.0–100.0)
Platelets: 302 10*3/uL (ref 150–400)
RBC: 4.47 MIL/uL (ref 3.87–5.11)
RDW: 12.3 % (ref 11.5–15.5)
WBC: 9.7 10*3/uL (ref 4.0–10.5)

## 2013-02-24 LAB — HCG, QUANTITATIVE, PREGNANCY: hCG, Beta Chain, Quant, S: 14672 m[IU]/mL — ABNORMAL HIGH (ref <5)

## 2013-02-24 SURGERY — DILATION AND EVACUATION, UTERUS
Anesthesia: Monitor Anesthesia Care | Site: Vagina | Wound class: Clean Contaminated

## 2013-02-24 MED ORDER — KETOROLAC TROMETHAMINE 30 MG/ML IJ SOLN
INTRAMUSCULAR | Status: DC | PRN
  Administered 2013-02-24: 30 mg via INTRAVENOUS

## 2013-02-24 MED ORDER — DEXAMETHASONE SODIUM PHOSPHATE 10 MG/ML IJ SOLN
INTRAMUSCULAR | Status: DC | PRN
  Administered 2013-02-24: 10 mg via INTRAVENOUS

## 2013-02-24 MED ORDER — OXYCODONE-ACETAMINOPHEN 5-325 MG PO TABS
ORAL_TABLET | ORAL | Status: AC
  Administered 2013-02-24: 1
  Filled 2013-02-24: qty 1

## 2013-02-24 MED ORDER — SILVER NITRATE-POT NITRATE 75-25 % EX MISC
CUTANEOUS | Status: AC
  Filled 2013-02-24: qty 1

## 2013-02-24 MED ORDER — MEPERIDINE HCL 25 MG/ML IJ SOLN: 6.2500 mg | INTRAMUSCULAR | Status: DC | PRN

## 2013-02-24 MED ORDER — METRONIDAZOLE IN NACL 5-0.79 MG/ML-% IV SOLN
500.0000 mg | Freq: Once | INTRAVENOUS | Status: AC
  Administered 2013-02-24: 500 g via INTRAVENOUS
  Filled 2013-02-24: qty 100

## 2013-02-24 MED ORDER — FENTANYL CITRATE 0.05 MG/ML IJ SOLN
INTRAMUSCULAR | Status: AC
  Filled 2013-02-24: qty 2

## 2013-02-24 MED ORDER — FENTANYL CITRATE 0.05 MG/ML IJ SOLN
INTRAMUSCULAR | Status: DC | PRN
  Administered 2013-02-24: 100 ug via INTRAVENOUS

## 2013-02-24 MED ORDER — PROPOFOL 10 MG/ML IV EMUL
INTRAVENOUS | Status: DC | PRN
  Administered 2013-02-24: 10 mg via INTRAVENOUS
  Administered 2013-02-24 (×3): 20 mg via INTRAVENOUS
  Administered 2013-02-24: 10 mg via INTRAVENOUS
  Administered 2013-02-24: 20 mg via INTRAVENOUS

## 2013-02-24 MED ORDER — LIDOCAINE HCL 1 % IJ SOLN
INTRAMUSCULAR | Status: DC | PRN
  Administered 2013-02-24: 20 mL

## 2013-02-24 MED ORDER — LACTATED RINGERS IV SOLN
INTRAVENOUS | Status: DC
  Administered 2013-02-24 (×2): via INTRAVENOUS

## 2013-02-24 MED ORDER — METOCLOPRAMIDE HCL 5 MG/ML IJ SOLN: 10.0000 mg | Freq: Once | INTRAMUSCULAR | Status: DC | PRN

## 2013-02-24 MED ORDER — LIDOCAINE HCL (CARDIAC) 20 MG/ML IV SOLN
INTRAVENOUS | Status: DC | PRN
  Administered 2013-02-24: 50 mg via INTRAVENOUS

## 2013-02-24 MED ORDER — FENTANYL CITRATE 0.05 MG/ML IJ SOLN
25.0000 ug | INTRAMUSCULAR | Status: DC | PRN
  Administered 2013-02-24: 50 ug via INTRAVENOUS
  Administered 2013-02-24: 25 ug via INTRAVENOUS

## 2013-02-24 MED ORDER — ONDANSETRON HCL 4 MG/2ML IJ SOLN
INTRAMUSCULAR | Status: DC | PRN
  Administered 2013-02-24: 4 mg via INTRAVENOUS

## 2013-02-24 MED ORDER — PROPOFOL 10 MG/ML IV EMUL
INTRAVENOUS | Status: AC
  Filled 2013-02-24: qty 20

## 2013-02-24 MED ORDER — SILVER NITRATE-POT NITRATE 75-25 % EX MISC
CUTANEOUS | Status: AC
  Filled 2013-02-24: qty 5

## 2013-02-24 MED ORDER — LIDOCAINE HCL (CARDIAC) 20 MG/ML IV SOLN
INTRAVENOUS | Status: AC
  Filled 2013-02-24: qty 5

## 2013-02-24 MED ORDER — SILVER NITRATE-POT NITRATE 75-25 % EX MISC
CUTANEOUS | Status: AC
  Filled 2013-02-24: qty 10

## 2013-02-24 MED ORDER — MIDAZOLAM HCL 2 MG/2ML IJ SOLN
INTRAMUSCULAR | Status: AC
  Filled 2013-02-24: qty 2

## 2013-02-24 MED ORDER — MIDAZOLAM HCL 5 MG/5ML IJ SOLN
INTRAMUSCULAR | Status: DC | PRN
  Administered 2013-02-24: 2 mg via INTRAVENOUS

## 2013-02-24 MED ORDER — ONDANSETRON HCL 4 MG/2ML IJ SOLN
INTRAMUSCULAR | Status: AC
  Filled 2013-02-24: qty 2

## 2013-02-24 MED ORDER — SILVER NITRATE-POT NITRATE 75-25 % EX MISC
CUTANEOUS | Status: DC | PRN
  Administered 2013-02-24: 8

## 2013-02-24 MED ORDER — OXYCODONE-ACETAMINOPHEN 7.5-325 MG PO TABS: 1.0000 | ORAL_TABLET | Freq: Four times a day (QID) | ORAL | Status: DC | PRN

## 2013-02-24 SURGICAL SUPPLY — 22 items
CATH ROBINSON RED A/P 16FR (CATHETERS) ×2 IMPLANT
CLOTH BEACON ORANGE TIMEOUT ST (SAFETY) ×2 IMPLANT
DECANTER SPIKE VIAL GLASS SM (MISCELLANEOUS) ×2 IMPLANT
GLOVE BIO SURGEON STRL SZ 6.5 (GLOVE) ×2 IMPLANT
GLOVE BIOGEL PI IND STRL 7.0 (GLOVE) ×1 IMPLANT
GLOVE BIOGEL PI INDICATOR 7.0 (GLOVE) ×1
GOWN STRL REIN XL XLG (GOWN DISPOSABLE) ×4 IMPLANT
KIT BERKELEY 1ST TRIMESTER 3/8 (MISCELLANEOUS) ×2 IMPLANT
NEEDLE SPNL 22GX3.5 QUINCKE BK (NEEDLE) ×2 IMPLANT
PACK VAGINAL MINOR WOMEN LF (CUSTOM PROCEDURE TRAY) ×2 IMPLANT
PAD OB MATERNITY 4.3X12.25 (PERSONAL CARE ITEMS) ×2 IMPLANT
PAD PREP 24X48 CUFFED NSTRL (MISCELLANEOUS) ×2 IMPLANT
SET BERKELEY SUCTION TUBING (SUCTIONS) ×2 IMPLANT
SUT VIC AB 2-0 SH 27 (SUTURE) ×1
SUT VIC AB 2-0 SH 27XBRD (SUTURE) ×1 IMPLANT
SYR CONTROL 10ML LL (SYRINGE) ×2 IMPLANT
TOWEL OR 17X24 6PK STRL BLUE (TOWEL DISPOSABLE) ×4 IMPLANT
VACURETTE 10 RIGID CVD (CANNULA) IMPLANT
VACURETTE 7MM CVD STRL WRAP (CANNULA) IMPLANT
VACURETTE 8 RIGID CVD (CANNULA) ×2 IMPLANT
VACURETTE 9 RIGID CVD (CANNULA) IMPLANT
WATER STERILE IRR 1000ML POUR (IV SOLUTION) ×2 IMPLANT

## 2013-02-24 NOTE — Anesthesia Postprocedure Evaluation (Signed)
Anesthesia Post Note  Patient: Ruth Cole  Procedure(s) Performed: Procedure(s) (LRB): DILATATION AND EVACUATION (N/A)  Anesthesia type: MAC  Patient location: PACU  Post pain: Pain level controlled  Post assessment: Post-op Vital signs reviewed  Last Vitals:  Filed Vitals:   02/24/13 1215  BP:   Pulse: 72  Temp: 36.7 C  Resp: 22    Post vital signs: Reviewed  Level of consciousness: sedated  Complications: No apparent anesthesia complications

## 2013-02-24 NOTE — H&P (Signed)
Ruth Cole is an 37 y.o. female [phone]  RP:  Incomplete Spontaneous Abortion, possible Molar pregnancy.  Pertinent Gynecological History: Menses: 1st trimester spotting. Contraception: none Blood transfusions: none Sexually transmitted diseases: no past history  Last pap: normal OB History: [id number]   Menstrual History:  Patient's last menstrual period was 12/25/2012.    Past Medical History  Diagnosis Date  . Chicken pox as a child  . Vaginal delivery     X 3  . H/O sinusitis     improved s/p surgical repair of deviated septum  . Urinary incontinence     s/p 3rd vaginal delivery, occasional  . Preventative health care 02/02/2012  . Insomnia 02/02/2012  . IBS (irritable bowel syndrome) 02/02/2012  . Allergy     seasonal  . PONV (postoperative nausea and vomiting)     Past Surgical History  Procedure Laterality Date  . Mandible fracture surgery  2008  . Deviated septum repair  teenager  . Cleft palate repair  as a child  . Skin biopsy      multiple all benign    Family History  Problem Relation Age of Onset  . Hypertension Mother   . Hyperlipidemia Mother   . Diabetes Father     type 2  . Stroke Maternal Grandmother     few  . Cancer Maternal Grandfather     stomach  . Heart attack Maternal Grandfather     few  . Cancer Paternal Grandfather     lung- nonsmoker    Social History:  reports that she has never smoked. She has never used smokeless tobacco. She reports that she does not drink alcohol or use illicit drugs.  Allergies: No Known Allergies  No prescriptions prior to admission    Blood pressure 104/74, pulse 92, temperature 98.1 F (36.7 C), temperature source Oral, resp. rate 18, height 5\' 4"  (1.626 m), weight 63.504 kg (140 lb), last menstrual period 12/25/2012, SpO2 100.00%.  Pelvic US:  POC with multicystic appearance. Reincrease in Quant. BHCG.  Results for orders placed during the hospital encounter of 02/24/13 (from the past 24  hour(s))  CBC     Status: None   Collection Time    02/24/13 10:40 AM      Result Value Range   WBC 9.7  4.0 - 10.5 K/uL   RBC 4.47  3.87 - 5.11 MIL/uL   Hemoglobin 14.0  12.0 - 15.0 g/dL   HCT 08.6  57.8 - 46.9 %   MCV 92.2  78.0 - 100.0 fL   MCH 31.3  26.0 - 34.0 pg   MCHC 34.0  30.0 - 36.0 g/dL   RDW 62.9  52.8 - 41.3 %   Platelets 302  150 - 400 K/uL    No results found.  Assessment/Plan: Incomplete spontaneous abortion, possible Molar pregnancy.  Surgery and risks reviewed.  Oslo Huntsman,MARIE-LYNE 02/24/2013, 11:12 AM

## 2013-02-24 NOTE — Transfer of Care (Signed)
Immediate Anesthesia Transfer of Care Note  Patient: Ruth Cole  Procedure(s) Performed: Procedure(s): DILATATION AND EVACUATION (N/A)  Patient Location: PACU  Anesthesia Type:MAC  Level of Consciousness: sedated  Airway & Oxygen Therapy: Patient Spontanous Breathing  Post-op Assessment: Report given to PACU RN  Post vital signs: Reviewed and stable  Complications: No apparent anesthesia complications

## 2013-02-24 NOTE — Discharge Summary (Signed)
  Physician Discharge Summary  Patient ID: Ruth Cole MRN: [id number] DOB/AGE: Apr 01, 1976 37 y.o.  Admit date: 02/24/2013 Discharge date: 02/24/2013  Admission Diagnoses: Incomplete spontaneous abortion, Possible Molar Pregnancy  [id number]  Discharge Diagnoses: Incomplete spontaneous abortion, Possible Molar Pregnancy  62130        Active Problems:   * No active hospital problems. *   Discharged Condition: good  Hospital Course: Outpatient  Consults: None  Treatments: surgery: D+E, suction, US guidance  Disposition: 81-Discharged to home/self-care with a planned acute care hospital inpt readmission     Medication List    TAKE these medications       oxyCODONE-acetaminophen 7.5-325 MG per tablet  Commonly known as:  PERCOCET  Take 1 tablet by mouth every 6 (six) hours as needed for pain.           Follow-up Information   Follow up with Jaquelinne Glendening,MARIE-LYNE, MD In 3 weeks. (Will do Quant BHCG qweek until negative.  Further management per pathology results.)    Contact information:   [address] [place] [address] [phone]       Signed: Genia Del, MD 02/24/2013, 12:29 PM

## 2013-02-24 NOTE — Progress Notes (Signed)
Pt had some mild nausea after dressing.  IV fluids restarted, tolerated ginger ale and crackers nausea relieved.

## 2013-02-24 NOTE — Op Note (Signed)
02/24/2013  12:14 PM  PATIENT:  Ruth Cole  37 y.o. female  PRE-OPERATIVE DIAGNOSIS:  Incomplete spontaneous abortion, Possible Molar Pregnancy  [id number]  POST-OPERATIVE DIAGNOSIS:  Incomplete spontaneous abortion, Possible molar pregnancy  PROCEDURE:  Procedure(s): DILATATION AND EVACUATION UNDER US GUIDANCE  SURGEON:  Surgeon(s): Genia Del, MD  ASSISTANTS: none   ANESTHESIA:   general  PROCEDURE:  Under general anesthesia with the mask, the patient was in lithotomy position. She was prepped with Betadine on the suprapubic, vulvar and vaginal areas. She was draped as usual.  Vaginal exam revealed an anteverted uterus about 8 cm mobile no adnexal mass. The weighted speculum was introduced in the vagina. The anterior lip of the cervix was grasped with a tenaculum.  Dilation of the cervix with Hegar dilators up to #27 easily. A #8 curved suction curette was used. We attempted to do the procedure under ultrasound guidance but the transabdominal views were suboptimal with an empty bladder. Products of conception were retrieved with suction of the intrauterine cavity.  We then used a sharp curet to gently curettage the intrauterine cavity on all surfaces.  We went back with the suction to make sure that all products of conception and blood clots were removed. The cavity was contracting well on the instruments. Minimal bleeding was present. The tenaculum was removed from the anterior lip of the cervix. At that point we had a significant bleeding from the anterior lip of the cervix at that location. We tried to control this with silver nitrate which was not successful. We therefore controlled hemostasis at that level with a figure of 8 of Vicryl 2-0.  We then removed the speculum. We did an endovaginal ultrasound to called farm a thin endometrial lining. Pictures were taken of a very thin endometrial lining. The patient was brought to recovery room in good and stable status.  ESTIMATED  BLOOD LOSS:  50 cc   Intake/Output Summary (Last 24 hours) at 02/24/13 1214 Last data filed at 02/24/13 1208  Gross per 24 hour  Intake   1500 ml  Output     75 ml  Net   1425 ml     BLOOD ADMINISTERED:none   LOCAL MEDICATIONS USED:  NESACAINE 1%, Amount: 20 ml  SPECIMEN:  Source of Specimen:  Products of conception  DISPOSITION OF SPECIMEN:  PATHOLOGY  COUNTS:  YES   PLAN OF CARE: Transfer to PACU  Genia Del  MD  02/24/13  At 12:15 pm

## 2013-02-24 NOTE — Anesthesia Preprocedure Evaluation (Signed)
Anesthesia Evaluation  Patient identified by MRN, date of birth, ID band Patient awake    Reviewed: Allergy & Precautions, H&P , NPO status , Patient's Chart, lab work & pertinent test results  History of Anesthesia Complications (+) PONV  Airway Mallampati: II TM Distance: >3 FB Neck ROM: Full    Dental no notable dental hx. (+) Teeth Intact   Pulmonary neg pulmonary ROS,  breath sounds clear to auscultation  Pulmonary exam normal       Cardiovascular negative cardio ROS  Rhythm:Regular Rate:Normal     Neuro/Psych negative neurological ROS  negative psych ROS   GI/Hepatic negative GI ROS, Neg liver ROS,   Endo/Other  negative endocrine ROS  Renal/GU negative Renal ROS  negative genitourinary   Musculoskeletal negative musculoskeletal ROS (+)   Abdominal   Peds  Hematology negative hematology ROS (+)   Anesthesia Other Findings   Reproductive/Obstetrics (+) Pregnancy Missed Ab Possible Molar Pregnancy                           Anesthesia Physical Anesthesia Plan  ASA: II  Anesthesia Plan: MAC and General   Post-op Pain Management:    Induction: Intravenous  Airway Management Planned: Natural Airway  Additional Equipment:   Intra-op Plan:   Post-operative Plan:   Informed Consent: I have reviewed the patients History and Physical, chart, labs and discussed the procedure including the risks, benefits and alternatives for the proposed anesthesia with the patient or authorized representative who has indicated his/her understanding and acceptance.   Dental advisory given  Plan Discussed with: Anesthesiologist, CRNA and Surgeon  Anesthesia Plan Comments:         Anesthesia Quick Evaluation

## 2013-02-25 ENCOUNTER — Encounter (HOSPITAL_COMMUNITY): Payer: Self-pay | Admitting: Obstetrics & Gynecology

## 2013-05-17 LAB — HM PAP SMEAR

## 2013-08-24 ENCOUNTER — Ambulatory Visit (HOSPITAL_COMMUNITY): Payer: 59 | Admitting: Anesthesiology

## 2013-08-24 ENCOUNTER — Encounter (HOSPITAL_COMMUNITY): Payer: Self-pay | Admitting: Pharmacy Technician

## 2013-08-24 ENCOUNTER — Ambulatory Visit (HOSPITAL_COMMUNITY)
Admission: RE | Admit: 2013-08-24 | Discharge: 2013-08-24 | Disposition: A | Discharge status: Home / Self Care | Payer: 59 | Source: Ambulatory Visit | Attending: Obstetrics & Gynecology | Admitting: Obstetrics & Gynecology

## 2013-08-24 ENCOUNTER — Encounter (HOSPITAL_COMMUNITY)
Admission: RE | Disposition: A | Discharge status: Home / Self Care | Payer: Self-pay | Source: Ambulatory Visit | Attending: Obstetrics & Gynecology

## 2013-08-24 ENCOUNTER — Other Ambulatory Visit: Payer: Self-pay | Admitting: Obstetrics & Gynecology

## 2013-08-24 ENCOUNTER — Encounter (HOSPITAL_COMMUNITY): Payer: Self-pay | Admitting: *Deleted

## 2013-08-24 DIAGNOSIS — O021 Missed abortion: Secondary | ICD-10-CM | POA: Insufficient documentation

## 2013-08-24 HISTORY — PX: DILATION AND EVACUATION: SHX1459

## 2013-08-24 LAB — CBC
HCT: 39.7 % (ref 36.0–46.0)
Hemoglobin: 13.5 g/dL (ref 12.0–15.0)
MCH: 30.2 pg (ref 26.0–34.0)
MCHC: 34 g/dL (ref 30.0–36.0)
MCV: 88.8 fL (ref 78.0–100.0)
Platelets: 263 10*3/uL (ref 150–400)
RBC: 4.47 MIL/uL (ref 3.87–5.11)
RDW: 11.9 % (ref 11.5–15.5)
WBC: 7.9 10*3/uL (ref 4.0–10.5)

## 2013-08-24 SURGERY — DILATION AND EVACUATION, UTERUS
Anesthesia: Monitor Anesthesia Care | Site: Vagina | Wound class: Contaminated

## 2013-08-24 MED ORDER — FENTANYL CITRATE 0.05 MG/ML IJ SOLN
INTRAMUSCULAR | Status: AC
  Filled 2013-08-24: qty 2

## 2013-08-24 MED ORDER — DIPHENHYDRAMINE HCL 50 MG/ML IJ SOLN
INTRAMUSCULAR | Status: AC
  Filled 2013-08-24: qty 1

## 2013-08-24 MED ORDER — KETOROLAC TROMETHAMINE 30 MG/ML IJ SOLN
INTRAMUSCULAR | Status: AC
  Filled 2013-08-24: qty 1

## 2013-08-24 MED ORDER — ONDANSETRON HCL 4 MG/2ML IJ SOLN
INTRAMUSCULAR | Status: AC
  Filled 2013-08-24: qty 2

## 2013-08-24 MED ORDER — LACTATED RINGERS IV SOLN
INTRAVENOUS | Status: DC
  Administered 2013-08-24 (×3): via INTRAVENOUS

## 2013-08-24 MED ORDER — PROPOFOL 10 MG/ML IV EMUL
INTRAVENOUS | Status: AC
  Filled 2013-08-24: qty 20

## 2013-08-24 MED ORDER — DEXAMETHASONE SODIUM PHOSPHATE 10 MG/ML IJ SOLN
INTRAMUSCULAR | Status: AC
  Filled 2013-08-24: qty 1

## 2013-08-24 MED ORDER — FENTANYL CITRATE 0.05 MG/ML IJ SOLN
INTRAMUSCULAR | Status: DC | PRN
  Administered 2013-08-24: 100 ug via INTRAVENOUS

## 2013-08-24 MED ORDER — LIDOCAINE HCL (CARDIAC) 20 MG/ML IV SOLN
INTRAVENOUS | Status: AC
  Filled 2013-08-24: qty 5

## 2013-08-24 MED ORDER — SILVER NITRATE-POT NITRATE 75-25 % EX MISC
CUTANEOUS | Status: AC
  Filled 2013-08-24: qty 1

## 2013-08-24 MED ORDER — ACETAMINOPHEN 160 MG/5ML PO SOLN
ORAL | Status: AC
  Filled 2013-08-24: qty 40.6

## 2013-08-24 MED ORDER — MIDAZOLAM HCL 2 MG/2ML IJ SOLN
INTRAMUSCULAR | Status: DC | PRN
  Administered 2013-08-24: 2 mg via INTRAVENOUS

## 2013-08-24 MED ORDER — CHLOROPROCAINE HCL 1 % IJ SOLN
INTRAMUSCULAR | Status: AC
  Filled 2013-08-24: qty 30

## 2013-08-24 MED ORDER — CHLOROPROCAINE HCL 1 % IJ SOLN
INTRAMUSCULAR | Status: DC | PRN
  Administered 2013-08-24: 10 mL

## 2013-08-24 MED ORDER — CEFAZOLIN SODIUM-DEXTROSE 2-3 GM-% IV SOLR
2.0000 g | INTRAVENOUS | Status: AC
  Administered 2013-08-24: 2 g via INTRAVENOUS

## 2013-08-24 MED ORDER — ONDANSETRON HCL 4 MG/2ML IJ SOLN
INTRAMUSCULAR | Status: DC | PRN
  Administered 2013-08-24: 4 mg via INTRAMUSCULAR

## 2013-08-24 MED ORDER — ACETAMINOPHEN 160 MG/5ML PO SOLN: 1000.0000 mg | Freq: Once | ORAL | Status: DC

## 2013-08-24 MED ORDER — FENTANYL CITRATE 0.05 MG/ML IJ SOLN
INTRAMUSCULAR | Status: AC
  Administered 2013-08-24: 25 ug via INTRAVENOUS
  Filled 2013-08-24: qty 2

## 2013-08-24 MED ORDER — KETOROLAC TROMETHAMINE 30 MG/ML IJ SOLN
INTRAMUSCULAR | Status: DC | PRN
  Administered 2013-08-24: 30 mg via INTRAVENOUS

## 2013-08-24 MED ORDER — DEXAMETHASONE SODIUM PHOSPHATE 10 MG/ML IJ SOLN
INTRAMUSCULAR | Status: DC | PRN
  Administered 2013-08-24: 10 mg via INTRAVENOUS

## 2013-08-24 MED ORDER — PROPOFOL 10 MG/ML IV EMUL
INTRAVENOUS | Status: DC | PRN
  Administered 2013-08-24: 20 mg via INTRAVENOUS
  Administered 2013-08-24: 30 mg via INTRAVENOUS
  Administered 2013-08-24: 50 mg via INTRAVENOUS
  Administered 2013-08-24: 20 mg via INTRAVENOUS
  Administered 2013-08-24: 50 mg via INTRAVENOUS

## 2013-08-24 MED ORDER — DIPHENHYDRAMINE HCL 50 MG/ML IJ SOLN
12.5000 mg | Freq: Once | INTRAMUSCULAR | Status: AC
  Administered 2013-08-24: 12.5 mg via INTRAVENOUS

## 2013-08-24 MED ORDER — MIDAZOLAM HCL 2 MG/2ML IJ SOLN
INTRAMUSCULAR | Status: AC
  Filled 2013-08-24: qty 2

## 2013-08-24 MED ORDER — FENTANYL CITRATE 0.05 MG/ML IJ SOLN
25.0000 ug | INTRAMUSCULAR | Status: DC | PRN
  Administered 2013-08-24 (×2): 25 ug via INTRAVENOUS

## 2013-08-24 MED ORDER — CEFAZOLIN SODIUM-DEXTROSE 2-3 GM-% IV SOLR
INTRAVENOUS | Status: AC
  Filled 2013-08-24: qty 50

## 2013-08-24 MED ORDER — LIDOCAINE HCL (CARDIAC) 20 MG/ML IV SOLN
INTRAVENOUS | Status: DC | PRN
  Administered 2013-08-24: 40 mg via INTRAVENOUS

## 2013-08-24 SURGICAL SUPPLY — 19 items
CATH ROBINSON RED A/P 16FR (CATHETERS) ×2 IMPLANT
CLOTH BEACON ORANGE TIMEOUT ST (SAFETY) ×2 IMPLANT
DECANTER SPIKE VIAL GLASS SM (MISCELLANEOUS) ×2 IMPLANT
GLOVE BIO SURGEON STRL SZ 6.5 (GLOVE) ×2 IMPLANT
GLOVE BIOGEL PI IND STRL 7.0 (GLOVE) ×1 IMPLANT
GLOVE BIOGEL PI INDICATOR 7.0 (GLOVE) ×1
GOWN STRL REIN XL XLG (GOWN DISPOSABLE) ×4 IMPLANT
KIT BERKELEY 1ST TRIMESTER 3/8 (MISCELLANEOUS) ×2 IMPLANT
NEEDLE SPNL 22GX3.5 QUINCKE BK (NEEDLE) ×2 IMPLANT
PACK VAGINAL MINOR WOMEN LF (CUSTOM PROCEDURE TRAY) ×2 IMPLANT
PAD OB MATERNITY 4.3X12.25 (PERSONAL CARE ITEMS) ×2 IMPLANT
PAD PREP 24X48 CUFFED NSTRL (MISCELLANEOUS) ×2 IMPLANT
SET BERKELEY SUCTION TUBING (SUCTIONS) ×2 IMPLANT
SYR CONTROL 10ML LL (SYRINGE) ×2 IMPLANT
TOWEL OR 17X24 6PK STRL BLUE (TOWEL DISPOSABLE) ×4 IMPLANT
VACURETTE 10 RIGID CVD (CANNULA) IMPLANT
VACURETTE 7MM CVD STRL WRAP (CANNULA) IMPLANT
VACURETTE 8 RIGID CVD (CANNULA) IMPLANT
VACURETTE 9 RIGID CVD (CANNULA) IMPLANT

## 2013-08-24 NOTE — H&P (Signed)
Ruth Cole is an 37 y.o. female [id number]  RP:  MAB 8+ wks, 3rd Spont. Ab.  Pertinent Gynecological History: Menses: Pregnant Bleeding: Spotting Contraception: none Blood transfusions: none Sexually transmitted diseases: no past history Previous GYN Procedures: DNC  Last mammogram: N/A Last pap: normal  OB History: [id number]   Menstrual History:  Patient's last menstrual period was 12/25/2012.    Past Medical History  Diagnosis Date  . Chicken pox as a child  . Vaginal delivery     X 3  . H/O sinusitis     improved s/p surgical repair of deviated septum  . Urinary incontinence     s/p 3rd vaginal delivery, occasional  . Preventative health care 02/02/2012  . Insomnia 02/02/2012  . IBS (irritable bowel syndrome) 02/02/2012  . Allergy     seasonal  . PONV (postoperative nausea and vomiting)     Past Surgical History  Procedure Laterality Date  . Mandible fracture surgery  2008  . Deviated septum repair  teenager  . Cleft palate repair  as a child  . Skin biopsy      multiple all benign  . Dilation and evacuation N/A 02/24/2013    Procedure: DILATATION AND EVACUATION;  Surgeon: Genia Del, MD;  Location: [facility];  Service: Gynecology;  Laterality: N/A;    Family History  Problem Relation Age of Onset  . Hypertension Mother   . Hyperlipidemia Mother   . Diabetes Father     type 2  . Stroke Maternal Grandmother     few  . Cancer Maternal Grandfather     stomach  . Heart attack Maternal Grandfather     few  . Cancer Paternal Grandfather     lung- nonsmoker    Social History:  reports that she has never smoked. She has never used smokeless tobacco. She reports that she does not drink alcohol or use illicit drugs.  Allergies: No Known Allergies  Prescriptions prior to admission  Medication Sig Dispense Refill  . oxyCODONE-acetaminophen (PERCOCET) 7.5-325 MG per tablet Take 1 tablet by mouth every 6 (six) hours as needed for pain.  20 tablet  0  .  Prenatal Vit-Fe Fumarate-FA (PRENATAL MULTIVITAMIN) TABS tablet Take 1 tablet by mouth daily at 12 noon.        ROS  Blood pressure 106/71, pulse 70, temperature 97.7 F (36.5 C), temperature source Oral, resp. rate 20, height 5\' 4"  (1.626 m), weight 64.411 kg (142 lb), last menstrual period 12/25/2012, SpO2 100.00%. Physical Exam  No results found for this or any previous visit (from the past 24 hour(s)).  No results found.  Assessment/Plan: MAB 8+ wks, 3rd Sp. Ab.  For D+E.  Will do Chromosomes/FISH.  Eliyana Pagliaro,MARIE-LYNE 08/24/2013, 10:05 AM

## 2013-08-24 NOTE — Transfer of Care (Signed)
Immediate Anesthesia Transfer of Care Note  Patient: Ruth Cole  Procedure(s) Performed: Procedure(s): DILATATION AND EVACUATION (N/A)  Patient Location: PACU  Anesthesia Type:MAC  Level of Consciousness: awake, alert , oriented and patient cooperative  Airway & Oxygen Therapy: Patient Spontanous Breathing and Patient connected to nasal cannula oxygen  Post-op Assessment: Report given to PACU RN, Post -op Vital signs reviewed and stable and Patient moving all extremities X 4  Post vital signs: Reviewed and stable  Complications: No apparent anesthesia complications

## 2013-08-24 NOTE — Op Note (Addendum)
08/24/2013  11:44 AM  PATIENT:  Ruth Cole  37 y.o. female  PRE-OPERATIVE DIAGNOSIS:  Missed Abortion [phone], 3rd Spontaneous Ab.  POST-OPERATIVE DIAGNOSIS:  Same  PROCEDURE:  Procedure(s): DILATATION AND EVACUATION  SURGEON:  Surgeon(s): Genia Del, MD  ASSISTANTS: none   ANESTHESIA:   paracervical block and MAC  PROCEDURE:  Under MAC analgesia, the patient is in lithotomy position. She is prepped with Betadine on the suprapubic, vulvar and vaginal areas. She is draped as usual. The vaginal exam reveals an anteverted uterus about 9 cm no adnexal mass. The speculum is inserted in the vagina and the anterior lip of the cervix is grasped with a tenaculum. A paracervical block is done with Nesacaine 1% 10 cc at 4 and 8:00.  Dilation of the cervix with Hegar dilators up to #31 without difficulty. A curved #9 suction curette is inserted in the intrauterine cavity and the products of conception are suctioned.  We then used a sharp curet to gently curettage the intrauterine cavity on all surfaces.  We go back with suction to make sure that all products of conception and blood clots are removed. The uterus contracts well and the instruments. Hemostasis is adequate.  The suction is removed. We removed the tenaculum on the anterior lip of the cervix and used silver nitrate to complete hemostasis. The speculum is removed as well. The patient is brought to recovery room in good and stable status.  Her Rh is positive.  ESTIMATED BLOOD LOSS:  25 cc   Intake/Output Summary (Last 24 hours) at 08/24/13 1144 Last data filed at 08/24/13 1144  Gross per 24 hour  Intake   1000 ml  Output    275 ml  Net    725 ml     BLOOD ADMINISTERED:none   LOCAL MEDICATIONS USED:  Nesacaine 1%, Amount: 10 ml  SPECIMEN:  Products of conception  DISPOSITION OF SPECIMEN:  Genetics for chromosomes and FISH  COUNTS:  YES  PLAN OF CARE: Transfer to PACU  Genia Del MD  08/24/2013 at 11:45 am

## 2013-08-24 NOTE — Anesthesia Postprocedure Evaluation (Signed)
  Anesthesia Post-op Note  Patient: Ruth Cole  Procedure(s) Performed: Procedure(s): DILATATION AND EVACUATION (N/A) Patient is awake and responsive. Pain and nausea are reasonably well controlled. Vital signs are stable and clinically acceptable. Oxygen saturation is clinically acceptable. There are no apparent anesthetic complications at this time. Patient is ready for discharge.

## 2013-08-24 NOTE — Discharge Summary (Signed)
  Physician Discharge Summary  Patient ID: DAYANA DALPORTO MRN: [id number] DOB/AGE: 1976/04/08 37 y.o.  Admit date: 08/24/2013 Discharge date: 08/24/2013  Admission Diagnoses: Missed Abortion 59820  Discharge Diagnoses: Missed Abortion 69629        Active Problems:   * No active hospital problems. *   Discharged Condition: good  Hospital Course: Outpatient  Consults: None  Treatments: surgery: D+E  Rh pos.  Disposition: 01-Home or Self Care     Medication List         oxyCODONE-acetaminophen 7.5-325 MG per tablet  Commonly known as:  PERCOCET  Take 1 tablet by mouth every 6 (six) hours as needed for pain.     prenatal multivitamin Tabs tablet  Take 1 tablet by mouth daily at 12 noon.           Follow-up Information   Follow up with Sevon Rotert,MARIE-LYNE, MD In 3 weeks.   Specialty:  Obstetrics and Gynecology   Contact information:   [address] [place] [address] [phone]       Signed: Genia Del, MD 08/24/2013, 11:53 AM

## 2013-08-24 NOTE — Anesthesia Preprocedure Evaluation (Signed)
Anesthesia Evaluation  Patient identified by MRN, date of birth, ID band Patient awake    Reviewed: Allergy & Precautions, H&P , Patient's Chart, lab work & pertinent test results, reviewed documented beta blocker date and time   History of Anesthesia Complications (+) PONV  Airway Mallampati: II TM Distance: >3 FB Neck ROM: full    Dental no notable dental hx.    Pulmonary  breath sounds clear to auscultation  Pulmonary exam normal       Cardiovascular Rhythm:regular Rate:Normal     Neuro/Psych    GI/Hepatic   Endo/Other    Renal/GU      Musculoskeletal   Abdominal   Peds  Hematology   Anesthesia Other Findings   Reproductive/Obstetrics                           Anesthesia Physical Anesthesia Plan  ASA: II  Anesthesia Plan: MAC   Post-op Pain Management:    Induction: Intravenous  Airway Management Planned: LMA, Mask and Natural Airway  Additional Equipment:   Intra-op Plan:   Post-operative Plan:   Informed Consent: I have reviewed the patients History and Physical, chart, labs and discussed the procedure including the risks, benefits and alternatives for the proposed anesthesia with the patient or authorized representative who has indicated his/her understanding and acceptance.   Dental Advisory Given  Plan Discussed with: CRNA and Surgeon  Anesthesia Plan Comments:         Anesthesia Quick Evaluation

## 2013-08-25 ENCOUNTER — Encounter (HOSPITAL_COMMUNITY): Payer: Self-pay | Admitting: Obstetrics & Gynecology

## 2013-09-14 LAB — TISSUE HYBRIDIZATION TO NCBH

## 2013-09-14 LAB — CHROMOSOME STD, POC(TISSUE)-NCBH

## 2013-09-22 ENCOUNTER — Other Ambulatory Visit: Payer: Self-pay

## 2013-09-29 ENCOUNTER — Encounter: Payer: Self-pay | Admitting: Family Medicine

## 2013-09-29 ENCOUNTER — Ambulatory Visit (INDEPENDENT_AMBULATORY_CARE_PROVIDER_SITE_OTHER): Payer: 59 | Admitting: Family Medicine

## 2013-09-29 VITALS — BP 104/76 | HR 87 | Temp 97.8°F | Resp 14 | Ht 64.0 in | Wt 148.0 lb

## 2013-09-29 DIAGNOSIS — Z0000 Encounter for general adult medical examination without abnormal findings: Secondary | ICD-10-CM

## 2013-09-29 DIAGNOSIS — B351 Tinea unguium: Secondary | ICD-10-CM

## 2013-09-29 MED ORDER — TERBINAFINE HCL 250 MG PO TABS: 250.0000 mg | ORAL_TABLET | Freq: Every day | ORAL | Status: DC

## 2013-09-29 NOTE — Patient Instructions (Signed)
Lavendar oil, distilled white vinegar and Lamisil ointment after shower   Ringworm, Nail A fungal infection of the nail (tinea unguium/onychomycosis) is common. It is common as the visible part of the nail is composed of dead cells which have no blood supply to help prevent infection. It occurs because fungi are everywhere and will pick any opportunity to grow on any dead material. Because nails are very slow growing they require up to 2 years of treatment with anti-fungal medications. The entire nail back to the base is infected. This includes approximately  of the nail which you cannot see. If your caregiver has prescribed a medication by mouth, take it every day and as directed. No progress will be seen for at least 6 to 9 months. Do not be disappointed! Because fungi live on dead cells with little or no exposure to blood supply, medication delivery to the infection is slow; thus the cure is slow. It is also why you can observe no progress in the first 6 months. The nail becoming cured is the base of the nail, as it has the blood supply. Topical medication such as creams and ointments are usually not effective. Important in successful treatment of nail fungus is closely following the medication regimen that your doctor prescribes. Sometimes you and your caregiver may elect to speed up this process by surgical removal of all the nails. Even this may still require 6 to 9 months of additional oral medications. See your caregiver as directed. Remember there will be no visible improvement for at least 6 months. See your caregiver sooner if other signs of infection (redness and swelling) develop. Document Released: 10/31/2000 Document Revised: 01/26/2012 Document Reviewed: 01/09/2009 [facility] Patient Information 2014 [place], [place].  NOW at [email or web address]

## 2013-09-29 NOTE — Progress Notes (Signed)
Pre-visit discussion using our clinic review tool. No additional management support is needed unless otherwise documented below in the visit note.  

## 2013-09-29 NOTE — Progress Notes (Signed)
Patient ID: Ruth Cole, female   DOB: [birth year], 37 y.o.   MRN: [id number] Ruth Cole [id number] 12/15/1975 09/29/2013      Progress Note-Follow Up  Subjective  Chief Complaint  Chief Complaint  Patient presents with  . Nail Problem    ;Pt c/o toenail issue, both feet, with discoloration; pain; flaking x3 mths    HPI  Onychomycosis. She's tried some over-the-counter treatments including hip is frustrated regarding the persistent thrombus in her log health. No significant discomfort. Has recently suffered a miscarriage and is interested in seeking treatment before she pregnancy. No other acute complaints. Allergies have not been flared. No recent illness. No complaints of chest pain palpitations shortness of breath there  Past Medical History  Diagnosis Date  . Chicken pox as a child  . Vaginal delivery     X 3  . H/O sinusitis     improved s/p surgical repair of deviated septum  . Urinary incontinence     s/p 3rd vaginal delivery, occasional  . Preventative health care 02/02/2012  . Insomnia 02/02/2012  . IBS (irritable bowel syndrome) 02/02/2012  . Allergy     seasonal  . PONV (postoperative nausea and vomiting)     Past Surgical History  Procedure Laterality Date  . Mandible fracture surgery  2008  . Deviated septum repair  teenager  . Cleft palate repair  as a child  . Skin biopsy      multiple all benign  . Dilation and evacuation N/A 02/24/2013    Procedure: DILATATION AND EVACUATION;  Surgeon: Genia Del, MD;  Location: [facility];  Service: Gynecology;  Laterality: N/A;  . Dilation and evacuation N/A 08/24/2013    Procedure: DILATATION AND EVACUATION;  Surgeon: Genia Del, MD;  Location: [facility];  Service: Gynecology;  Laterality: N/A;    Family History  Problem Relation Age of Onset  . Hypertension Mother   . Hyperlipidemia Mother   . Diabetes Father     type 2  . Stroke Maternal Grandmother     few  . Cancer Maternal Grandfather     stomach  . Heart attack Maternal Grandfather     few  . Cancer Paternal Grandfather     lung- nonsmoker    History   Social History  . Marital Status: Married    Spouse Name: N/A    Number of Children: N/A  . Years of Education: N/A   Occupational History  . Not on file.   Social History Main Topics  . Smoking status: Never Smoker   . Smokeless tobacco: Never Used  . Alcohol Use: No  . Drug Use: No  . Sexual Activity: Yes    Partners: Male     Comment: approx [redacted] wks gestation   Other Topics Concern  . Not on file   Social History Narrative  . No narrative on file    Current Outpatient Prescriptions on File Prior to Visit  Medication Sig Dispense Refill  . Prenatal Vit-Fe Fumarate-FA (PRENATAL MULTIVITAMIN) TABS tablet Take 1 tablet by mouth daily at 12 noon.       No current facility-administered medications on file prior to visit.    No Known Allergies  Review of Systems  Review of Systems  Constitutional: Negative for fever and malaise/fatigue.  HENT: Negative for congestion.   Eyes: Negative for discharge.  Respiratory: Negative for shortness of breath.   Cardiovascular: Negative for chest pain, palpitations and leg swelling.  Gastrointestinal: Negative for nausea, abdominal pain  and diarrhea.  Genitourinary: Negative for dysuria.  Musculoskeletal: Negative for falls.  Skin: Negative for rash.       Thick toenaila  Neurological: Negative for loss of consciousness and headaches.  Endo/Heme/Allergies: Negative for polydipsia.  Psychiatric/Behavioral: Negative for depression and suicidal ideas. The patient is not nervous/anxious and does not have insomnia.     Objective  BP 104/76  Pulse 87  Temp(Src) 97.8 F (36.6 C) (Oral)  Resp 14  Ht 5\' 4"  (1.626 m)  Wt 148 lb (67.132 kg)  BMI 25.39 kg/m2  SpO2 98%  LMP 09/21/2013  Breastfeeding? Unknown  Physical Exam  Physical Exam  Constitutional: She is oriented to person, place, and time and  well-developed, well-nourished, and in no distress. No distress.  HENT:  Head: Normocephalic and atraumatic.  Eyes: Conjunctivae are normal.  Neck: Neck supple. No thyromegaly present.  Cardiovascular: Normal rate, regular rhythm and normal heart sounds.   No murmur heard. Pulmonary/Chest: Effort normal and breath sounds normal. She has no wheezes.  Abdominal: She exhibits no distension and no mass.  Musculoskeletal: She exhibits no edema.  Lymphadenopathy:    She has no cervical adenopathy.  Neurological: She is alert and oriented to person, place, and time.  Skin: Skin is warm and dry. No rash noted. She is not diaphoretic.  Psychiatric: Memory, affect and judgment normal.    No results found for this basename: TSH   Lab Results  Component Value Date   WBC 7.9 08/24/2013   HGB 13.5 08/24/2013   HCT 39.7 08/24/2013   MCV 88.8 08/24/2013   PLT 263 08/24/2013   Lab Results  Component Value Date   CREATININE 0.57 01/30/2013   BUN 8 01/30/2013   NA 139 01/30/2013   K 3.2* 01/30/2013   CL 103 01/30/2013   CO2 23 01/30/2013   Lab Results  Component Value Date   ALT 9 01/30/2013   AST 12 01/30/2013   ALKPHOS 52 01/30/2013   BILITOT 1.3* 01/30/2013     Assessment & Plan  Onychomycosis Discussed topical treatments and over the counter options, if these are not successful is given an rx for Lamisil to use po.   Preventative health care Patient is recovering from a recent miscarriage and is feeling relatively well. Is contemplating a repeat pregnancy. Will return for annual exam once she is decided on her fuure course.

## 2013-10-02 ENCOUNTER — Encounter: Payer: Self-pay | Admitting: Family Medicine

## 2013-10-02 DIAGNOSIS — B351 Tinea unguium: Secondary | ICD-10-CM

## 2013-10-02 HISTORY — DX: Tinea unguium: B35.1

## 2013-10-02 NOTE — Assessment & Plan Note (Signed)
Discussed topical treatments and over the counter options, if these are not successful is given an rx for Lamisil to use po.

## 2013-10-02 NOTE — Assessment & Plan Note (Signed)
Patient is recovering from a recent miscarriage and is feeling relatively well. Is contemplating a repeat pregnancy. Will return for annual exam once she is decided on her fuure course.

## 2013-11-17 NOTE — L&D Delivery Note (Signed)
Delivery Note At 5:38 PM a healthy female was delivered via Vaginal, Spontaneous Delivery (Presentation: ; Occiput Anterior).  1 loose .  APGAR: 6, 8; weight 8 lb 1.6 oz (3674 g).   Placenta status: Intact, Spontaneous.  Cord: 3 vessels with the following complications: None.  Cord pH: pending  Anesthesia: Epidural  Episiotomy: None Lacerations: 2nd degree;Perineal Suture Repair: 2.0 3.0 vicryl vicryl rapide Est. Blood Loss (mL):  300 Mom to postpartum.  Baby to Couplet care / Skin to Skin.  Jasmina Gendron,MARIE-LYNE 10/01/2014, 6:10 PM

## 2013-11-21 ENCOUNTER — Ambulatory Visit (INDEPENDENT_AMBULATORY_CARE_PROVIDER_SITE_OTHER): Payer: 59 | Admitting: Physician Assistant

## 2013-11-21 ENCOUNTER — Encounter: Payer: Self-pay | Admitting: Physician Assistant

## 2013-11-21 VITALS — BP 100/66 | HR 120 | Temp 98.6°F | Resp 16 | Ht 64.0 in | Wt 144.2 lb

## 2013-11-21 DIAGNOSIS — J329 Chronic sinusitis, unspecified: Secondary | ICD-10-CM

## 2013-11-21 MED ORDER — AMOXICILLIN-POT CLAVULANATE 875-125 MG PO TABS: 1.0000 | ORAL_TABLET | Freq: Two times a day (BID) | ORAL | Status: DC

## 2013-11-21 NOTE — Progress Notes (Signed)
Pre visit review using our clinic review tool, if applicable. No additional management support is needed unless otherwise documented below in the visit note/SLS  

## 2013-11-21 NOTE — Patient Instructions (Signed)
Take antibiotic as prescribed.  Increase fluid intake.  Rest.  Saline nasal spray.  Take a daily probiotic.  Humidifier in bedroom.  Discontinue use of Sudafed.  Plain Mucinex for congestions, Delsym for cough.  Return in 1 week for pulse and blood pressure recheck.

## 2013-11-22 DIAGNOSIS — J069 Acute upper respiratory infection, unspecified: Secondary | ICD-10-CM | POA: Insufficient documentation

## 2013-11-22 HISTORY — DX: Acute upper respiratory infection, unspecified: J06.9

## 2013-11-22 NOTE — Assessment & Plan Note (Signed)
Rx Augmentin. Increase fluid intake. Rest. Saline nasal spray. Plain Mucinex. DC Sudafed as it could be contributing to tachycardia. Humidifier in bedroom. Probiotic. Followup in one week for tachycardia, and to reassess symptoms.

## 2013-11-22 NOTE — Progress Notes (Signed)
Patient ID: Ruth Cole, female   DOB: December 30, 1975, 38 y.o.   MRN: [id number]  Patient presents to clinic today complaining of 1-1/2 weeks of head congestion, sinus pain, sinus pressure, postnasal drip, dry cough, chills, and intermittent body aches. Patient also has felt fatigued. Patient denies fever or. Denies shortness of breath or wheezing. Denies recent travel or sick contact. Patient has taken Sudafed over the past several days for her symptoms. Patient denies history of hypertension. Patient is tachycardic with heart rate at 120 beats per minute.  Past Medical History  Diagnosis Date  . Chicken pox as a child  . Vaginal delivery     X 3  . H/O sinusitis     improved s/p surgical repair of deviated septum  . Urinary incontinence     s/p 3rd vaginal delivery, occasional  . Preventative health care 02/02/2012  . Insomnia 02/02/2012  . IBS (irritable bowel syndrome) 02/02/2012  . Allergy     seasonal  . PONV (postoperative nausea and vomiting)   . Onychomycosis 10/02/2013    Current Outpatient Prescriptions on File Prior to Visit  Medication Sig Dispense Refill  . Prenatal Vit-Fe Fumarate-FA (PRENATAL MULTIVITAMIN) TABS tablet Take 1 tablet by mouth daily at 12 noon.      . terbinafine (LAMISIL) 250 MG tablet Take 1 tablet (250 mg total) by mouth daily.  30 tablet  1   No current facility-administered medications on file prior to visit.    No Known Allergies  Family History  Problem Relation Age of Onset  . Hypertension Mother   . Hyperlipidemia Mother   . Diabetes Father     type 2  . Stroke Maternal Grandmother     few  . Cancer Maternal Grandfather     stomach  . Heart attack Maternal Grandfather     few  . Cancer Paternal Grandfather     lung- nonsmoker    History   Social History  . Marital Status: Married    Spouse Name: N/A    Number of Children: N/A  . Years of Education: N/A   Social History Main Topics  . Smoking status: Never Smoker   .  Smokeless tobacco: Never Used  . Alcohol Use: No  . Drug Use: No  . Sexual Activity: Yes    Partners: Male     Comment: approx [redacted] wks gestation   Other Topics Concern  . None   Social History Narrative  . None   Review of Systems - See HPI.  All other ROS are negative.  Filed Vitals:   11/21/13 0908  BP: 100/66  Pulse: 120  Temp: 98.6 F (37 C)  Resp: 16   Physical Exam  Vitals reviewed. Constitutional: She is oriented to person, place, and time and well-developed, well-nourished, and in no distress.  HENT:  Head: Normocephalic and atraumatic.  Right Ear: External ear normal.  Left Ear: External ear normal.  Nose: Nose normal.  Mouth/Throat: Oropharynx is clear and moist. No oropharyngeal exudate.  Eyes: Conjunctivae are normal. Pupils are equal, round, and reactive to light.  Neck: Neck supple.  Cardiovascular: Regular rhythm, normal heart sounds and intact distal pulses.   Tachycardic  Pulmonary/Chest: Effort normal and breath sounds normal. No respiratory distress. She has no wheezes. She has no rales. She exhibits no tenderness.  Lymphadenopathy:    She has no cervical adenopathy.  Neurological: She is alert and oriented to person, place, and time.  Skin: Skin is warm and dry. No  rash noted.  Psychiatric: Affect normal.    No results found for this or any previous visit (from the past 2160 hour(s)).  Assessment/Plan: No problem-specific assessment & plan notes found for this encounter.

## 2013-11-28 ENCOUNTER — Ambulatory Visit (INDEPENDENT_AMBULATORY_CARE_PROVIDER_SITE_OTHER): Payer: 59 | Admitting: Family Medicine

## 2013-11-28 VITALS — BP 90/64 | HR 90 | Resp 16

## 2013-11-28 DIAGNOSIS — J329 Chronic sinusitis, unspecified: Secondary | ICD-10-CM

## 2013-12-04 NOTE — Progress Notes (Signed)
Not seen by MD today

## 2013-12-08 ENCOUNTER — Ambulatory Visit (INDEPENDENT_AMBULATORY_CARE_PROVIDER_SITE_OTHER): Payer: 59 | Admitting: Family Medicine

## 2013-12-08 ENCOUNTER — Encounter: Payer: Self-pay | Admitting: Family Medicine

## 2013-12-08 VITALS — BP 92/62 | HR 67 | Temp 98.0°F | Ht 64.0 in | Wt 144.1 lb

## 2013-12-08 DIAGNOSIS — Z0000 Encounter for general adult medical examination without abnormal findings: Secondary | ICD-10-CM

## 2013-12-08 DIAGNOSIS — K589 Irritable bowel syndrome without diarrhea: Secondary | ICD-10-CM

## 2013-12-08 DIAGNOSIS — Z8759 Personal history of other complications of pregnancy, childbirth and the puerperium: Secondary | ICD-10-CM

## 2013-12-08 DIAGNOSIS — Z8742 Personal history of other diseases of the female genital tract: Secondary | ICD-10-CM

## 2013-12-08 LAB — HEPATIC FUNCTION PANEL
ALT: 14 U/L (ref 0–35)
AST: 16 U/L (ref 0–37)
Albumin: 4.4 g/dL (ref 3.5–5.2)
Alkaline Phosphatase: 53 U/L (ref 39–117)
Bilirubin, Direct: 0.2 mg/dL (ref 0.0–0.3)
Indirect Bilirubin: 0.8 mg/dL (ref 0.0–0.9)
Total Bilirubin: 1 mg/dL (ref 0.3–1.2)
Total Protein: 7 g/dL (ref 6.0–8.3)

## 2013-12-08 LAB — CBC
HCT: 40.5 % (ref 36.0–46.0)
Hemoglobin: 13.8 g/dL (ref 12.0–15.0)
MCH: 30.3 pg (ref 26.0–34.0)
MCHC: 34.1 g/dL (ref 30.0–36.0)
MCV: 88.8 fL (ref 78.0–100.0)
Platelets: 389 10*3/uL (ref 150–400)
RBC: 4.56 MIL/uL (ref 3.87–5.11)
RDW: 12.9 % (ref 11.5–15.5)
WBC: 9.4 10*3/uL (ref 4.0–10.5)

## 2013-12-08 LAB — RENAL FUNCTION PANEL
Albumin: 4.4 g/dL (ref 3.5–5.2)
BUN: 10 mg/dL (ref 6–23)
CO2: 29 mEq/L (ref 19–32)
Calcium: 9.7 mg/dL (ref 8.4–10.5)
Chloride: 102 mEq/L (ref 96–112)
Creat: 0.54 mg/dL (ref 0.50–1.10)
Glucose, Bld: 87 mg/dL (ref 70–99)
Phosphorus: 3.4 mg/dL (ref 2.3–4.6)
Potassium: 4.6 mEq/L (ref 3.5–5.3)
Sodium: 139 mEq/L (ref 135–145)

## 2013-12-08 LAB — LIPID PANEL
Cholesterol: 137 mg/dL (ref 0–200)
HDL: 41 mg/dL (ref >39)
LDL Cholesterol: 71 mg/dL (ref 0–99)
Total CHOL/HDL Ratio: 3.3 Ratio
Triglycerides: 125 mg/dL (ref <150)
VLDL: 25 mg/dL (ref 0–40)

## 2013-12-08 LAB — TSH: TSH: 0.862 u[IU]/mL (ref 0.350–4.500)

## 2013-12-08 NOTE — Progress Notes (Signed)
Patient ID: Ruth Cole, female   DOB: 07/02/1976, 38 y.o.   MRN: [id number] Ruth Cole [id number] 06-23-1976 12/08/2013      Progress Note-Follow Up  Subjective  Chief Complaint  Chief Complaint  Patient presents with  . Annual Exam    physical    HPI  Patient is a 38 year old Caucasian female who is in today for annual exam. She is tearful secondary to a recent miscarriage. She had genetic testing done and was found to have that the baby had triploidy, this was her third miscarriage. Her previous miscarriage was dictated by food poisoning. At this time she feels well other than being fed and is trying to decide whether or not she tried again. Otherwise feels well. No chest pain, palpitations, shortness of breath, GI or GU concerns noted today.  Past Medical History  Diagnosis Date  . Chicken pox as a child  . Vaginal delivery     X 3  . H/O sinusitis     improved s/p surgical repair of deviated septum  . Urinary incontinence     s/p 3rd vaginal delivery, occasional  . Preventative health care 02/02/2012  . Insomnia 02/02/2012  . IBS (irritable bowel syndrome) 02/02/2012  . Allergy     seasonal  . PONV (postoperative nausea and vomiting)   . Onychomycosis 10/02/2013    Past Surgical History  Procedure Laterality Date  . Mandible fracture surgery  2008  . Deviated septum repair  teenager  . Cleft palate repair  as a child  . Skin biopsy      multiple all benign  . Dilation and evacuation N/A 02/24/2013    Procedure: DILATATION AND EVACUATION;  Surgeon: Princess Bruins, MD;  Location: [facility];  Service: Gynecology;  Laterality: N/A;  . Dilation and evacuation N/A 08/24/2013    Procedure: DILATATION AND EVACUATION;  Surgeon: Princess Bruins, MD;  Location: [facility];  Service: Gynecology;  Laterality: N/A;    Family History  Problem Relation Age of Onset  . Hypertension Mother   . Hyperlipidemia Mother   . Diabetes Father     type 2  . Stroke Maternal  Grandmother     few  . Cancer Maternal Grandfather     stomach  . Heart attack Maternal Grandfather     few  . Cancer Paternal Grandfather     lung- nonsmoker    History   Social History  . Marital Status: Married    Spouse Name: N/A    Number of Children: N/A  . Years of Education: N/A   Occupational History  . Not on file.   Social History Main Topics  . Smoking status: Never Smoker   . Smokeless tobacco: Never Used  . Alcohol Use: No  . Drug Use: No  . Sexual Activity: Yes    Partners: Male     Comment: approx [redacted] wks gestation   Other Topics Concern  . Not on file   Social History Narrative  . No narrative on file    Current Outpatient Prescriptions on File Prior to Visit  Medication Sig Dispense Refill  . Prenatal Vit-Fe Fumarate-FA (PRENATAL MULTIVITAMIN) TABS tablet Take 1 tablet by mouth daily at 12 noon.       No current facility-administered medications on file prior to visit.    No Known Allergies  Review of Systems  Review of Systems  Constitutional: Negative for fever and malaise/fatigue.  HENT: Negative for congestion.   Eyes: Negative for discharge.  Respiratory:  Negative for shortness of breath.   Cardiovascular: Negative for chest pain, palpitations and leg swelling.  Gastrointestinal: Negative for nausea, abdominal pain and diarrhea.  Genitourinary: Negative for dysuria.  Musculoskeletal: Negative for falls.  Skin: Negative for rash.  Neurological: Negative for loss of consciousness and headaches.  Endo/Heme/Allergies: Negative for polydipsia.  Psychiatric/Behavioral: Negative for depression and suicidal ideas. The patient is not nervous/anxious and does not have insomnia.     Objective  BP 92/62  Pulse 67  Temp(Src) 98 F (36.7 C) (Oral)  Ht 5\' 4"  (1.626 m)  Wt 144 lb 1.3 oz (65.354 kg)  BMI 24.72 kg/m2  SpO2 97%  LMP 12/02/2013  Physical Exam  Physical Exam  Constitutional: She is oriented to person, place, and time and  well-developed, well-nourished, and in no distress. No distress.  HENT:  Head: Normocephalic and atraumatic.  Eyes: Conjunctivae are normal.  Neck: Neck supple. No thyromegaly present.  Cardiovascular: Normal rate, regular rhythm and normal heart sounds.   No murmur heard. Pulmonary/Chest: Effort normal and breath sounds normal. She has no wheezes.  Abdominal: She exhibits no distension and no mass.  Musculoskeletal: She exhibits no edema.  Lymphadenopathy:    She has no cervical adenopathy.  Neurological: She is alert and oriented to person, place, and time.  Skin: Skin is warm and dry. No rash noted. She is not diaphoretic.  Psychiatric: Memory, affect and judgment normal.    No results found for this basename: TSH   Lab Results  Component Value Date   WBC 7.9 08/24/2013   HGB 13.5 08/24/2013   HCT 39.7 08/24/2013   MCV 88.8 08/24/2013   PLT 263 08/24/2013   Lab Results  Component Value Date   CREATININE 0.57 01/30/2013   BUN 8 01/30/2013   NA 139 01/30/2013   K 3.2* 01/30/2013   CL 103 01/30/2013   CO2 23 01/30/2013   Lab Results  Component Value Date   ALT 9 01/30/2013   AST 12 01/30/2013   ALKPHOS 52 01/30/2013   BILITOT 1.3* 01/30/2013     Assessment & Plan  Preventative health care Encouraged heart healthy diet, regular exercise and adequate sleep. Fasting labs obtained and reviewed today  Miscarriage within last 12 months Patient is now a G6P3, most recently lost a triplody fetus, she is very sad and is trying to decide whether to try again, is following with gynecology.   IBS (irritable bowel syndrome) Improved with dietary changes and probiotics

## 2013-12-08 NOTE — Progress Notes (Signed)
Pre visit review using our clinic review tool, if applicable. No additional management support is needed unless otherwise documented below in the visit note. 

## 2013-12-08 NOTE — Patient Instructions (Addendum)
Digestive Advantage gummies, by [employer]  Triclosan  GMOs   DASH Diet The DASH diet stands for "Dietary Approaches to Stop Hypertension." It is a healthy eating plan that has been shown to reduce high blood pressure (hypertension) in as little as 14 days, while also possibly providing other significant health benefits. These other health benefits include reducing the risk of breast cancer after menopause and reducing the risk of type 2 diabetes, heart disease, colon cancer, and stroke. Health benefits also include weight loss and slowing kidney failure in patients with chronic kidney disease.  DIET GUIDELINES  Limit salt (sodium). Your diet should contain less than 1500 mg of sodium daily.  Limit refined or processed carbohydrates. Your diet should include mostly whole grains. Desserts and added sugars should be used sparingly.  Include small amounts of heart-healthy fats. These types of fats include nuts, oils, and tub margarine. Limit saturated and trans fats. These fats have been shown to be harmful in the body. CHOOSING FOODS  The following food groups are based on a 2000 calorie diet. See your Registered Dietitian for individual calorie needs. Grains and Grain Products (6 to 8 servings daily)  Eat More Often: Whole-wheat bread, brown rice, whole-grain or wheat pasta, quinoa, popcorn without added fat or salt (air popped).  Eat Less Often: White bread, white pasta, white rice, cornbread. Vegetables (4 to 5 servings daily)  Eat More Often: Fresh, frozen, and canned vegetables. Vegetables may be raw, steamed, roasted, or grilled with a minimal amount of fat.  Eat Less Often/Avoid: Creamed or fried vegetables. Vegetables in a cheese sauce. Fruit (4 to 5 servings daily)  Eat More Often: All fresh, canned (in natural juice), or frozen fruits. Dried fruits without added sugar. One hundred percent fruit juice ( cup [237 mL] daily).  Eat Less Often: Dried fruits with added sugar. Canned  fruit in light or heavy syrup. [employer], Fish, and Poultry (2 servings or less daily. One serving is 3 to 4 oz [85-114 g]).  Eat More Often: Ninety percent or leaner ground beef, tenderloin, sirloin. Round cuts of beef, chicken breast, [place] breast. All fish. Grill, bake, or broil your meat. Nothing should be fried.  Eat Less Often/Avoid: Fatty cuts of meat, [place], or chicken leg, thigh, or wing. Fried cuts of meat or fish. Dairy (2 to 3 servings)  Eat More Often: Low-fat or fat-free milk, low-fat plain or light yogurt, reduced-fat or part-skim cheese.  Eat Less Often/Avoid: Milk (whole, 2%).Whole milk yogurt. Full-fat cheeses. Nuts, Seeds, and Legumes (4 to 5 servings per week)  Eat More Often: All without added salt.  Eat Less Often/Avoid: Salted nuts and seeds, canned beans with added salt. Fats and Sweets (limited)  Eat More Often: Vegetable oils, tub margarines without trans fats, sugar-free gelatin. Mayonnaise and salad dressings.  Eat Less Often/Avoid: Coconut oils, palm oils, butter, stick margarine, cream, half and half, cookies, candy, pie. FOR MORE INFORMATION The Dash Diet Eating Plan: www.dashdiet.org Document Released: 10/23/2011 Document Revised: 01/26/2012 Document Reviewed: 10/23/2011 [facility] Patient Information 2014 [place], [place].

## 2013-12-12 ENCOUNTER — Encounter: Payer: Self-pay | Admitting: Family Medicine

## 2013-12-12 DIAGNOSIS — Z8759 Personal history of other complications of pregnancy, childbirth and the puerperium: Secondary | ICD-10-CM

## 2013-12-12 HISTORY — DX: Personal history of other complications of pregnancy, childbirth and the puerperium: Z87.59

## 2013-12-12 NOTE — Assessment & Plan Note (Signed)
Improved with dietary changes and probiotics

## 2013-12-12 NOTE — Assessment & Plan Note (Addendum)
Encouraged heart healthy diet, regular exercise and adequate sleep. Fasting labs obtained and reviewed today

## 2013-12-12 NOTE — Assessment & Plan Note (Signed)
Patient is now a G6P3, most recently lost a triplody fetus, she is very sad and is trying to decide whether to try again, is following with gynecology.

## 2014-02-24 ENCOUNTER — Encounter (HOSPITAL_COMMUNITY): Payer: Self-pay | Admitting: *Deleted

## 2014-02-24 ENCOUNTER — Inpatient Hospital Stay (HOSPITAL_COMMUNITY)
Admission: AD | Admit: 2014-02-24 | Discharge: 2014-02-25 | Disposition: A | Discharge status: Home / Self Care | Payer: 59 | Source: Ambulatory Visit | Attending: Obstetrics and Gynecology | Admitting: Obstetrics and Gynecology

## 2014-02-24 DIAGNOSIS — E860 Dehydration: Secondary | ICD-10-CM

## 2014-02-24 DIAGNOSIS — O99891 Other specified diseases and conditions complicating pregnancy: Secondary | ICD-10-CM | POA: Insufficient documentation

## 2014-02-24 DIAGNOSIS — A084 Viral intestinal infection, unspecified: Secondary | ICD-10-CM

## 2014-02-24 DIAGNOSIS — O210 Mild hyperemesis gravidarum: Secondary | ICD-10-CM | POA: Insufficient documentation

## 2014-02-24 DIAGNOSIS — A088 Other specified intestinal infections: Secondary | ICD-10-CM | POA: Insufficient documentation

## 2014-02-24 DIAGNOSIS — R197 Diarrhea, unspecified: Secondary | ICD-10-CM | POA: Insufficient documentation

## 2014-02-24 DIAGNOSIS — O9989 Other specified diseases and conditions complicating pregnancy, childbirth and the puerperium: Principal | ICD-10-CM

## 2014-02-24 LAB — URINALYSIS, ROUTINE W REFLEX MICROSCOPIC
Bilirubin Urine: NEGATIVE
Glucose, UA: NEGATIVE mg/dL
Ketones, ur: NEGATIVE mg/dL
Leukocytes, UA: NEGATIVE
Nitrite: NEGATIVE
Protein, ur: NEGATIVE mg/dL
Specific Gravity, Urine: 1.025 (ref 1.005–1.030)
Urobilinogen, UA: 1 mg/dL (ref 0.0–1.0)
pH: 6 (ref 5.0–8.0)

## 2014-02-24 LAB — URINE MICROSCOPIC-ADD ON

## 2014-02-24 NOTE — MAU Note (Signed)
My kids have been sick with n/v/d. I have had n/v today. Had diarrhea this am and took imodium. Had food poisoning a yr ago and was 6wks preg and had sab so i am very concerned

## 2014-02-25 ENCOUNTER — Encounter (HOSPITAL_COMMUNITY): Payer: Self-pay

## 2014-02-25 MED ORDER — ONDANSETRON 4 MG PO TBDP
4.0000 mg | ORAL_TABLET | Freq: Once | ORAL | Status: AC
  Administered 2014-02-25: 4 mg via ORAL
  Filled 2014-02-25: qty 1

## 2014-02-25 MED ORDER — ONDANSETRON 4 MG PO TBDP: 4.0000 mg | ORAL_TABLET | Freq: Three times a day (TID) | ORAL | Status: DC | PRN

## 2014-02-25 MED ORDER — PROMETHAZINE HCL 25 MG PO TABS
25.0000 mg | ORAL_TABLET | Freq: Once | ORAL | Status: AC
  Administered 2014-02-25: 25 mg via ORAL
  Filled 2014-02-25: qty 1

## 2014-02-25 NOTE — MAU Provider Note (Signed)
History     CSN: [id number]  Arrival date and time: 02/24/14 2309 CNM notified of arrival @0020  CNM at bedside @0030   None     Chief Complaint  Patient presents with  . Nausea  . Emesis During Pregnancy  . Diarrhea   HPI Comments: [id number] @[redacted]w[redacted]d  by sono c/o nausea, vomiting, and diarrhea since yesterday. Three episodes of vomiting, last around 2200. Took Zofran and Phenergan with little effect. Tolerating some po fluids. No fever, possible chills. Household members have been sick with similar sx. No VB. Mild abd (GI) cramping.   Diarrhea  Associated symptoms include chills.    OB History   Grav Para Term Preterm Abortions TAB SAB Ect Mult Living   7 3 2 1 3  0 3 0 0 3      Past Medical History  Diagnosis Date  . Chicken pox as a child  . Vaginal delivery     X 3  . H/O sinusitis     improved s/p surgical repair of deviated septum  . Urinary incontinence     s/p 3rd vaginal delivery, occasional  . Preventative health care 02/02/2012  . Insomnia 02/02/2012  . IBS (irritable bowel syndrome) 02/02/2012  . Allergy     seasonal  . PONV (postoperative nausea and vomiting)   . Onychomycosis 10/02/2013  . Miscarriage within last 12 months 12/12/2013    Past Surgical History  Procedure Laterality Date  . Mandible fracture surgery  2008  . Deviated septum repair  teenager  . Cleft palate repair  as a child  . Skin biopsy      multiple all benign  . Dilation and evacuation N/A 02/24/2013    Procedure: DILATATION AND EVACUATION;  Surgeon: Princess Bruins, MD;  Location: [facility];  Service: Gynecology;  Laterality: N/A;  . Dilation and evacuation N/A 08/24/2013    Procedure: DILATATION AND EVACUATION;  Surgeon: Princess Bruins, MD;  Location: [facility];  Service: Gynecology;  Laterality: N/A;    Family History  Problem Relation Age of Onset  . Hypertension Mother   . Hyperlipidemia Mother   . Diabetes Father     type 2  . Stroke Maternal Grandmother     few  . Cancer  Maternal Grandfather     stomach  . Heart attack Maternal Grandfather     few  . Cancer Paternal Grandfather     lung- nonsmoker    History  Substance Use Topics  . Smoking status: Never Smoker   . Smokeless tobacco: Never Used  . Alcohol Use: No    Allergies: No Known Allergies  Prescriptions prior to admission  Medication Sig Dispense Refill  . aspirin 81 MG tablet Take 81 mg by mouth daily.      . Prenatal Vit-Fe Fumarate-FA (PRENATAL MULTIVITAMIN) TABS tablet Take 1 tablet by mouth daily at 12 noon.      . progesterone (PROMETRIUM) 200 MG capsule Take 200 mg by mouth daily.        Review of Systems  Constitutional: Positive for chills.  HENT: Negative.   Eyes: Negative.   Respiratory: Negative.   Cardiovascular: Negative.   Gastrointestinal: Positive for diarrhea.  Genitourinary: Negative for dysuria, urgency, frequency and hematuria.  Musculoskeletal: Negative.   Skin: Negative.   Neurological: Negative.   Endo/Heme/Allergies: Negative.   Psychiatric/Behavioral:       Mildly anxious   Physical Exam   Blood pressure 98/58, pulse 107, temperature 97.8 F (36.6 C), resp. rate 18, height 5\' 4"  (1.626  m), weight 65.137 kg (143 lb 9.6 oz), last menstrual period 12/02/2013.  Physical Exam  Constitutional: She is oriented to person, place, and time. She appears well-developed and well-nourished.  HENT:  Head: Normocephalic.  Neck: Normal range of motion.  Cardiovascular: Regular rhythm.   Respiratory: Effort normal and breath sounds normal.  GI: Soft. Bowel sounds are normal. She exhibits no distension. There is no tenderness. There is no rebound and no guarding.  Genitourinary:  deferred  Musculoskeletal: Normal range of motion.  Neurological: She is alert and oriented to person, place, and time.  Skin: Skin is warm and dry.  Psychiatric: She has a normal mood and affect.  UA-WNL  MAU Course  Procedures  Po challenge-tolerated Phenergan po-no effect,  worsened nausea, no vomiting Zofran ODT-some effect  Assessment and Plan  8.[redacted] weeks gestation Viral Gastroenteritis Nausea   Discharge home BRAT diet, maintain adequate hydration Zofran ODT prn Keep scheduled appt. Call for worsening sx.    Ruth Cole 02/25/2014, 12:59 AM

## 2014-02-25 NOTE — Discharge Instructions (Signed)
Viral Gastroenteritis Viral gastroenteritis is also known as stomach flu. This condition affects the stomach and intestinal tract. It can cause sudden diarrhea and vomiting. The illness typically lasts 3 to 8 days. Most people develop an immune response that eventually gets rid of the virus. While this natural response develops, the virus can make you quite ill. CAUSES  Many different viruses can cause gastroenteritis, such as rotavirus or noroviruses. You can catch one of these viruses by consuming contaminated food or water. You may also catch a virus by sharing utensils or other personal items with an infected person or by touching a contaminated surface. SYMPTOMS  The most common symptoms are diarrhea and vomiting. These problems can cause a severe loss of body fluids (dehydration) and a body salt (electrolyte) imbalance. Other symptoms may include:  Fever.  Headache.  Fatigue.  Abdominal pain. DIAGNOSIS  Your caregiver can usually diagnose viral gastroenteritis based on your symptoms and a physical exam. A stool sample may also be taken to test for the presence of viruses or other infections. TREATMENT  This illness typically goes away on its own. Treatments are aimed at rehydration. The most serious cases of viral gastroenteritis involve vomiting so severely that you are not able to keep fluids down. In these cases, fluids must be given through an intravenous line (IV). HOME CARE INSTRUCTIONS   Drink enough fluids to keep your urine clear or pale yellow. Drink small amounts of fluids frequently and increase the amounts as tolerated.  Ask your caregiver for specific rehydration instructions.  Avoid:  Foods high in sugar.  Alcohol.  Carbonated drinks.  Tobacco.  Juice.  Caffeine drinks.  Extremely hot or cold fluids.  Fatty, greasy foods.  Too much intake of anything at one time.  Dairy products until 24 to 48 hours after diarrhea stops.  You may consume probiotics.  Probiotics are active cultures of beneficial bacteria. They may lessen the amount and number of diarrheal stools in adults. Probiotics can be found in yogurt with active cultures and in supplements.  Wash your hands well to avoid spreading the virus.  Only take over-the-counter or prescription medicines for pain, discomfort, or fever as directed by your caregiver. Do not give aspirin to children. Antidiarrheal medicines are not recommended.  Ask your caregiver if you should continue to take your regular prescribed and over-the-counter medicines.  Keep all follow-up appointments as directed by your caregiver. SEEK IMMEDIATE MEDICAL CARE IF:   You are unable to keep fluids down.  You do not urinate at least once every 6 to 8 hours.  You develop shortness of breath.  You notice blood in your stool or vomit. This may look like coffee grounds.  You have abdominal pain that increases or is concentrated in one small area (localized).  You have persistent vomiting or diarrhea.  You have a fever.  The patient is a child younger than 3 months, and he or she has a fever.  The patient is a child older than 3 months, and he or she has a fever and persistent symptoms.  The patient is a child older than 3 months, and he or she has a fever and symptoms suddenly get worse.  The patient is a baby, and he or she has no tears when crying. MAKE SURE YOU:   Understand these instructions.  Will watch your condition.  Will get help right away if you are not doing well or get worse. Document Released: 11/03/2005 Document Revised: 01/26/2012 Document Reviewed: 08/20/2011  or she has no tears when crying.  MAKE SURE YOU:   · Understand these instructions.  · Will watch your condition.  · Will get help right away if you are not doing well or get worse.  Document Released: 11/03/2005 Document Revised: 01/26/2012 Document Reviewed: 08/20/2011  ExitCare® Patient Information ©2014 [employer].  Diet for Diarrhea, Adult  Frequent, runny stools (diarrhea) may be caused or worsened by food or drink. Diarrhea may be relieved by changing your diet. Since diarrhea can last up to 7 days, it is easy for you to lose too much fluid from the body and become dehydrated. Fluids that are  lost need to be replaced. Along with a modified diet, make sure you drink enough fluids to keep your urine clear or pale yellow.  DIET INSTRUCTIONS  · Ensure adequate fluid intake (hydration): have 1 cup (8 oz) of fluid for each diarrhea episode. Avoid fluids that contain simple sugars or sports drinks, fruit juices, whole milk products, and sodas. Your urine should be clear or pale yellow if you are drinking enough fluids. Hydrate with an oral rehydration solution that you can purchase at pharmacies, retail stores, and online. You can prepare an oral rehydration solution at home by mixing the following ingredients together:  ·   tsp table salt.  · ¾ tsp baking soda.  ·  tsp salt substitute containing potassium chloride.  · 1  tablespoons sugar.  · 1 L (34 oz) of water.  · Certain foods and beverages may increase the speed at which food moves through the gastrointestinal (GI) tract. These foods and beverages should be avoided and include:  · Caffeinated and alcoholic beverages.  · High-fiber foods, such as raw fruits and vegetables, nuts, seeds, and whole grain breads and cereals.  · Foods and beverages sweetened with sugar alcohols, such as xylitol, sorbitol, and mannitol.  · Some foods may be well tolerated and may help thicken stool including:  · Starchy foods, such as rice, toast, pasta, low-sugar cereal, oatmeal, grits, baked potatoes, crackers, and bagels.    · Bananas.    · Applesauce.  · Add probiotic-rich foods to help increase healthy bacteria in the GI tract, such as yogurt and fermented milk products.  RECOMMENDED FOODS AND BEVERAGES  Starches  Choose foods with less than 2 g of fiber per serving.  · Recommended:  White, French, and pita breads, plain rolls, buns, bagels. Plain muffins, matzo. Soda, saltine, or graham crackers. Pretzels, melba toast, zwieback. Cooked cereals made with water: cornmeal, farina, cream cereals. Dry cereals: refined corn, wheat, rice. Potatoes prepared any way without skins,  refined macaroni, spaghetti, noodles, refined rice.  · Avoid:  Bread, rolls, or crackers made with whole wheat, multi-grains, rye, bran seeds, nuts, or coconut. Corn tortillas or taco shells. Cereals containing whole grains, multi-grains, bran, coconut, nuts, raisins. Cooked or dry oatmeal. Coarse wheat cereals, granola. Cereals advertised as "high-fiber." Potato skins. Whole grain pasta, wild or brown rice. Popcorn. Sweet potatoes, yams. Sweet rolls, doughnuts, waffles, pancakes, sweet breads.  Vegetables  · Recommended: Strained tomato and vegetable juices. Most well-cooked and canned vegetables without seeds. Fresh: Tender lettuce, cucumber without the skin, cabbage, spinach, bean sprouts.  · Avoid: Fresh, cooked, or canned: Artichokes, baked beans, beet greens, broccoli, Brussels sprouts, corn, kale, legumes, peas, sweet potatoes. Cooked: Green or red cabbage, spinach. Avoid large servings of any vegetables because vegetables shrink when cooked, and they contain more fiber per serving than fresh vegetables.  Fruit  · Recommended: Cooked   or canned: Apricots, applesauce, cantaloupe, cherries, fruit cocktail, grapefruit, grapes, kiwi, mandarin oranges, peaches, pears, plums, watermelon. Fresh: Apples without skin, ripe banana, grapes, cantaloupe, cherries, grapefruit, peaches, oranges, plums. Keep servings limited to ½ cup or 1 piece.  · Avoid: Fresh: Apples with skin, apricots, mangoes, pears, raspberries, strawberries. Prune juice, stewed or dried prunes. Dried fruits, raisins, dates. Large servings of all fresh fruits.  Protein  · Recommended: Ground or well-cooked tender beef, ham, veal, lamb, pork, or poultry. Eggs. Fish, oysters, shrimp, lobster, other seafoods. Liver, organ meats.  · Avoid: Tough, fibrous meats with gristle. Peanut butter, smooth or chunky. Cheese, nuts, seeds, legumes, dried peas, beans, lentils.  Dairy  · Recommended: Yogurt, lactose-free milk, kefir, drinkable yogurt, buttermilk, soy  milk, or plain hard cheese.  · Avoid: Milk, chocolate milk, beverages made with milk, such as milkshakes.  Soups  · Recommended: Bouillon, broth, or soups made from allowed foods. Any strained soup.  · Avoid: Soups made from vegetables that are not allowed, cream or milk-based soups.  Desserts and Sweets  · Recommended: Sugar-free gelatin, sugar-free frozen ice pops made without sugar alcohol.  · Avoid: Plain cakes and cookies, pie made with fruit, pudding, custard, cream pie. Gelatin, fruit, ice, sherbet, frozen ice pops. Ice cream, ice milk without nuts. Plain hard candy, honey, jelly, molasses, syrup, sugar, chocolate syrup, gumdrops, marshmallows.  Fats and Oils  · Recommended: Limit fats to less than 8 tsp per day.  · Avoid: Seeds, nuts, olives, avocados. Margarine, butter, cream, mayonnaise, salad oils, plain salad dressings. Plain gravy, crisp bacon without rind.  Beverages  · Recommended: Water, decaffeinated teas, oral rehydration solutions, sugar-free beverages not sweetened with sugar alcohols.  · Avoid: Fruit juices, caffeinated beverages (coffee, tea, soda), alcohol, sports drinks, or lemon-lime soda.  Condiments  · Recommended: Ketchup, mustard, horseradish, vinegar, cocoa powder. Spices in moderation: allspice, basil, bay leaves, celery powder or leaves, cinnamon, cumin powder, curry powder, ginger, mace, marjoram, onion or garlic powder, oregano, paprika, parsley flakes, ground pepper, rosemary, sage, savory, tarragon, thyme, turmeric.  · Avoid: Coconut, honey.  Document Released: 01/24/2004 Document Revised: 07/28/2012 Document Reviewed: 03/19/2012  ExitCare® Patient Information ©2014 [employer].

## 2014-03-07 LAB — OB RESULTS CONSOLE RUBELLA ANTIBODY, IGM: Rubella: IMMUNE

## 2014-03-07 LAB — OB RESULTS CONSOLE HIV ANTIBODY (ROUTINE TESTING): HIV: NONREACTIVE

## 2014-03-07 LAB — OB RESULTS CONSOLE ABO/RH: RH Type: POSITIVE

## 2014-03-07 LAB — OB RESULTS CONSOLE GC/CHLAMYDIA
Chlamydia: NEGATIVE
Gonorrhea: NEGATIVE

## 2014-03-07 LAB — OB RESULTS CONSOLE HEPATITIS B SURFACE ANTIGEN: Hepatitis B Surface Ag: NEGATIVE

## 2014-03-07 LAB — OB RESULTS CONSOLE RPR: RPR: NONREACTIVE

## 2014-04-21 ENCOUNTER — Inpatient Hospital Stay (HOSPITAL_COMMUNITY)
Admission: AD | Admit: 2014-04-21 | Discharge: 2014-04-21 | Disposition: A | Discharge status: Home / Self Care | Payer: 59 | Source: Ambulatory Visit | Attending: Obstetrics | Admitting: Obstetrics

## 2014-04-21 DIAGNOSIS — J342 Deviated nasal septum: Secondary | ICD-10-CM | POA: Insufficient documentation

## 2014-04-21 DIAGNOSIS — O99891 Other specified diseases and conditions complicating pregnancy: Secondary | ICD-10-CM | POA: Insufficient documentation

## 2014-04-21 DIAGNOSIS — K589 Irritable bowel syndrome without diarrhea: Secondary | ICD-10-CM | POA: Insufficient documentation

## 2014-04-21 DIAGNOSIS — O9989 Other specified diseases and conditions complicating pregnancy, childbirth and the puerperium: Principal | ICD-10-CM

## 2014-04-21 DIAGNOSIS — Y92410 Unspecified street and highway as the place of occurrence of the external cause: Secondary | ICD-10-CM | POA: Insufficient documentation

## 2014-04-21 LAB — URINALYSIS, ROUTINE W REFLEX MICROSCOPIC
Bilirubin Urine: NEGATIVE
Glucose, UA: NEGATIVE mg/dL
Hgb urine dipstick: NEGATIVE
Ketones, ur: NEGATIVE mg/dL
Leukocytes, UA: NEGATIVE
Nitrite: NEGATIVE
Protein, ur: NEGATIVE mg/dL
Specific Gravity, Urine: 1.01 (ref 1.005–1.030)
Urobilinogen, UA: 0.2 mg/dL (ref 0.0–1.0)
pH: 7 (ref 5.0–8.0)
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
## 2014-09-01 LAB — OB RESULTS CONSOLE GBS: GBS: NEGATIVE

## 2014-09-18 ENCOUNTER — Encounter (HOSPITAL_COMMUNITY): Payer: Self-pay

## 2014-09-22 ENCOUNTER — Other Ambulatory Visit: Payer: Self-pay | Admitting: Obstetrics & Gynecology

## 2014-10-01 ENCOUNTER — Encounter (HOSPITAL_COMMUNITY): Payer: Self-pay | Admitting: *Deleted

## 2014-10-01 ENCOUNTER — Inpatient Hospital Stay (HOSPITAL_COMMUNITY): Payer: 59 | Admitting: Anesthesiology

## 2014-10-01 ENCOUNTER — Inpatient Hospital Stay (HOSPITAL_COMMUNITY)
Admission: AD | Admit: 2014-10-01 | Discharge: 2014-10-03 | DRG: 775 | Disposition: A | Discharge status: Home / Self Care | Payer: 59 | Source: Ambulatory Visit | Attending: Obstetrics & Gynecology | Admitting: Obstetrics & Gynecology

## 2014-10-01 DIAGNOSIS — Z3483 Encounter for supervision of other normal pregnancy, third trimester: Secondary | ICD-10-CM | POA: Diagnosis present

## 2014-10-01 DIAGNOSIS — O09523 Supervision of elderly multigravida, third trimester: Secondary | ICD-10-CM

## 2014-10-01 DIAGNOSIS — Z3A39 39 weeks gestation of pregnancy: Secondary | ICD-10-CM | POA: Diagnosis present

## 2014-10-01 LAB — CBC
HCT: 41.5 % (ref 36.0–46.0)
Hemoglobin: 14.3 g/dL (ref 12.0–15.0)
MCH: 31.4 pg (ref 26.0–34.0)
MCHC: 34.5 g/dL (ref 30.0–36.0)
MCV: 91.2 fL (ref 78.0–100.0)
Platelets: 232 10*3/uL (ref 150–400)
RBC: 4.55 MIL/uL (ref 3.87–5.11)
RDW: 14 % (ref 11.5–15.5)
WBC: 12.2 10*3/uL — ABNORMAL HIGH (ref 4.0–10.5)

## 2014-10-01 LAB — TYPE AND SCREEN
ABO/RH(D): O POS
Antibody Screen: NEGATIVE

## 2014-10-01 LAB — RPR

## 2014-10-01 MED ORDER — OXYTOCIN BOLUS FROM INFUSION: 500.0000 mL | INTRAVENOUS | Status: DC

## 2014-10-01 MED ORDER — OXYCODONE-ACETAMINOPHEN 5-325 MG PO TABS: 1.0000 | ORAL_TABLET | ORAL | Status: DC | PRN

## 2014-10-01 MED ORDER — ACETAMINOPHEN 325 MG PO TABS: 650.0000 mg | ORAL_TABLET | ORAL | Status: DC | PRN

## 2014-10-01 MED ORDER — ONDANSETRON HCL 4 MG/2ML IJ SOLN: 4.0000 mg | Freq: Four times a day (QID) | INTRAMUSCULAR | Status: DC | PRN

## 2014-10-01 MED ORDER — TETANUS-DIPHTH-ACELL PERTUSSIS 5-2.5-18.5 LF-MCG/0.5 IM SUSP
0.5000 mL | Freq: Once | INTRAMUSCULAR | Status: DC
  Filled 2014-10-01: qty 0.5

## 2014-10-01 MED ORDER — WITCH HAZEL-GLYCERIN EX PADS: 1.0000 "application " | MEDICATED_PAD | CUTANEOUS | Status: DC | PRN

## 2014-10-01 MED ORDER — OXYCODONE-ACETAMINOPHEN 5-325 MG PO TABS
2.0000 | ORAL_TABLET | ORAL | Status: DC | PRN
Start: 2014-10-01 — End: 2014-10-01

## 2014-10-01 MED ORDER — DIPHENHYDRAMINE HCL 25 MG PO CAPS: 25.0000 mg | ORAL_CAPSULE | Freq: Four times a day (QID) | ORAL | Status: DC | PRN

## 2014-10-01 MED ORDER — PRENATAL MULTIVITAMIN CH
1.0000 | ORAL_TABLET | Freq: Every day | ORAL | Status: DC
  Administered 2014-10-02: 1 via ORAL
  Filled 2014-10-01: qty 1

## 2014-10-01 MED ORDER — LIDOCAINE HCL (PF) 1 % IJ SOLN
30.0000 mL | INTRAMUSCULAR | Status: DC | PRN
  Administered 2014-10-01: 30 mL via SUBCUTANEOUS
  Filled 2014-10-01: qty 30

## 2014-10-01 MED ORDER — PHENYLEPHRINE 40 MCG/ML (10ML) SYRINGE FOR IV PUSH (FOR BLOOD PRESSURE SUPPORT): 80.0000 ug | PREFILLED_SYRINGE | INTRAVENOUS | Status: DC | PRN

## 2014-10-01 MED ORDER — OXYTOCIN 40 UNITS IN LACTATED RINGERS INFUSION - SIMPLE MED
62.5000 mL/h | INTRAVENOUS | Status: DC
Start: 2014-10-01 — End: 2014-10-01
  Administered 2014-10-01: 999 mL/h via INTRAVENOUS
  Filled 2014-10-01: qty 1000

## 2014-10-01 MED ORDER — DIBUCAINE 1 % RE OINT
1.0000 "application " | TOPICAL_OINTMENT | RECTAL | Status: DC | PRN
  Filled 2014-10-01: qty 28

## 2014-10-01 MED ORDER — LIDOCAINE HCL (PF) 1 % IJ SOLN
INTRAMUSCULAR | Status: DC | PRN
  Administered 2014-10-01 (×2): 8 mL

## 2014-10-01 MED ORDER — IBUPROFEN 600 MG PO TABS
600.0000 mg | ORAL_TABLET | Freq: Four times a day (QID) | ORAL | Status: DC
  Administered 2014-10-01 – 2014-10-03 (×7): 600 mg via ORAL
  Filled 2014-10-01 (×7): qty 1

## 2014-10-01 MED ORDER — SIMETHICONE 80 MG PO CHEW: 80.0000 mg | CHEWABLE_TABLET | ORAL | Status: DC | PRN

## 2014-10-01 MED ORDER — OXYCODONE-ACETAMINOPHEN 5-325 MG PO TABS: 2.0000 | ORAL_TABLET | ORAL | Status: DC | PRN

## 2014-10-01 MED ORDER — BENZOCAINE-MENTHOL 20-0.5 % EX AERO
1.0000 "application " | INHALATION_SPRAY | CUTANEOUS | Status: DC | PRN
  Filled 2014-10-01 (×2): qty 56

## 2014-10-01 MED ORDER — EPHEDRINE 5 MG/ML INJ: 10.0000 mg | INTRAVENOUS | Status: DC | PRN

## 2014-10-01 MED ORDER — DIPHENHYDRAMINE HCL 50 MG/ML IJ SOLN: 12.5000 mg | INTRAMUSCULAR | Status: DC | PRN

## 2014-10-01 MED ORDER — SENNOSIDES-DOCUSATE SODIUM 8.6-50 MG PO TABS
2.0000 | ORAL_TABLET | ORAL | Status: DC
  Administered 2014-10-01 – 2014-10-02 (×2): 2 via ORAL
  Filled 2014-10-01 (×2): qty 2

## 2014-10-01 MED ORDER — LACTATED RINGERS IV SOLN
INTRAVENOUS | Status: DC
  Administered 2014-10-01: 16:00:00 via INTRAVENOUS

## 2014-10-01 MED ORDER — LACTATED RINGERS IV SOLN
500.0000 mL | INTRAVENOUS | Status: DC | PRN
  Administered 2014-10-01: 1000 mL via INTRAVENOUS

## 2014-10-01 MED ORDER — FENTANYL 2.5 MCG/ML BUPIVACAINE 1/10 % EPIDURAL INFUSION (WH - ANES)
4.0000 mL/h | INTRAMUSCULAR | Status: DC | PRN
  Filled 2014-10-01: qty 125

## 2014-10-01 MED ORDER — ONDANSETRON HCL 4 MG PO TABS: 4.0000 mg | ORAL_TABLET | ORAL | Status: DC | PRN

## 2014-10-01 MED ORDER — PHENYLEPHRINE 40 MCG/ML (10ML) SYRINGE FOR IV PUSH (FOR BLOOD PRESSURE SUPPORT)
80.0000 ug | PREFILLED_SYRINGE | INTRAVENOUS | Status: DC | PRN
  Filled 2014-10-01: qty 10

## 2014-10-01 MED ORDER — LANOLIN HYDROUS EX OINT
TOPICAL_OINTMENT | CUTANEOUS | Status: DC | PRN
Start: 2014-10-01 — End: 2014-10-03

## 2014-10-01 MED ORDER — ZOLPIDEM TARTRATE 5 MG PO TABS: 5.0000 mg | ORAL_TABLET | Freq: Every evening | ORAL | Status: DC | PRN

## 2014-10-01 MED ORDER — OXYCODONE-ACETAMINOPHEN 5-325 MG PO TABS
1.0000 | ORAL_TABLET | ORAL | Status: DC | PRN
  Administered 2014-10-01 – 2014-10-03 (×6): 1 via ORAL
  Filled 2014-10-01 (×6): qty 1

## 2014-10-01 MED ORDER — FENTANYL 2.5 MCG/ML BUPIVACAINE 1/10 % EPIDURAL INFUSION (WH - ANES)
INTRAMUSCULAR | Status: DC | PRN
  Administered 2014-10-01: 14 mL/h via EPIDURAL

## 2014-10-01 MED ORDER — ONDANSETRON HCL 4 MG/2ML IJ SOLN: 4.0000 mg | INTRAMUSCULAR | Status: DC | PRN

## 2014-10-01 MED ORDER — LACTATED RINGERS IV SOLN: 500.0000 mL | Freq: Once | INTRAVENOUS | Status: DC

## 2014-10-01 MED ORDER — CITRIC ACID-SODIUM CITRATE 334-500 MG/5ML PO SOLN: 30.0000 mL | ORAL | Status: DC | PRN

## 2014-10-01 NOTE — Anesthesia Preprocedure Evaluation (Signed)

## 2014-10-01 NOTE — H&P (Signed)
Ruth Cole is a 38 y.o. female [phone] [redacted]w[redacted]d presenting for Spontaneous Labor.  RA:  Painful regular UC q2-3 min.  OB History    Gravida Para Term Preterm AB TAB SAB Ectopic Multiple Living   7 3 2 1 3  0 3 0 0 3     Past Medical History  Diagnosis Date  . Chicken pox as a child  . Vaginal delivery     X 3  . H/O sinusitis     improved s/p surgical repair of deviated septum  . Urinary incontinence     s/p 3rd vaginal delivery, occasional  . Preventative health care 02/02/2012  . Insomnia 02/02/2012  . IBS (irritable bowel syndrome) 02/02/2012  . Allergy     seasonal  . PONV (postoperative nausea and vomiting)   . Onychomycosis 10/02/2013  . Miscarriage within last 12 months 12/12/2013   Past Surgical History  Procedure Laterality Date  . Mandible fracture surgery  2008  . Deviated septum repair  teenager  . Cleft palate repair  as a child  . Skin biopsy      multiple all benign  . Dilation and evacuation N/A 02/24/2013    Procedure: DILATATION AND EVACUATION;  Surgeon: Princess Bruins, MD;  Location: [facility];  Service: Gynecology;  Laterality: N/A;  . Dilation and evacuation N/A 08/24/2013    Procedure: DILATATION AND EVACUATION;  Surgeon: Princess Bruins, MD;  Location: [facility];  Service: Gynecology;  Laterality: N/A;   Family History: family history includes Cancer in her maternal grandfather and paternal grandfather; Diabetes in her father; Heart attack in her maternal grandfather; Hyperlipidemia in her mother; Hypertension in her mother; Stroke in her maternal grandmother. Social History:  reports that she has never smoked. She has never used smokeless tobacco. She reports that she does not drink alcohol or use illicit drugs.  No Known Allergies  Dilation: 4 Effacement (%): 70 Station: -3 Exam by:: Ginger Morris rn   FHR 140's, accelerations present, good variability, no deceleration. UCs q2-3 min ++  Blood pressure 113/87, pulse 87, temperature 97.4 F (36.3  C), temperature source Oral, resp. rate 18, height 5\' 4"  (1.626 m), weight 77.565 kg (171 lb), last menstrual period 12/02/2013. Exam Physical Exam  HPP:  Patient Active Problem List   Diagnosis Date Noted  . Labor and delivery indication for care or intervention 10/01/2014  . Miscarriage within last 12 months 12/12/2013  . Sinusitis 11/22/2013  . Onychomycosis 10/02/2013  . Preventative health care 02/02/2012  . Insomnia 02/02/2012  . IBS (irritable bowel syndrome) 02/02/2012  . Chicken pox   . H/O sinusitis   . Urinary incontinence   . Allergic state     Prenatal labs: ABO, Rh:  O pos Antibody:  Neg Rubella:  Immune RPR:  NR  HBsAg:  NR  HIV:  NR Genetic testing: Informaseq normal, AFP1 neg [place] anato: wnl 1 hr GTT: 132 GBS:  neg  Assessment/Plan: 39+ wks spontaneous labor.  Multiparous.  FHR monitoring reassuring.  Expectant management towards Vaginal Delivery.   Ludivina Guymon,MARIE-LYNE 10/01/2014, 2:08 PM

## 2014-10-01 NOTE — Anesthesia Procedure Notes (Signed)
Epidural Patient location during procedure: OB Start time: 10/01/2014 4:10 PM End time: 10/01/2014 4:14 PM  Staffing Anesthesiologist: Lyn Hollingshead  Preanesthetic Checklist Completed: patient identified, surgical consent, pre-op evaluation, timeout performed, IV checked, risks and benefits discussed and monitors and equipment checked  Epidural Patient position: sitting Prep: site prepped and draped and DuraPrep Patient monitoring: continuous pulse ox and blood pressure Approach: midline Location: L3-L4 Injection technique: LOR air  Needle:  Needle type: Tuohy  Needle gauge: 17 G Needle length: 9 cm and 9 Needle insertion depth: 6 cm Catheter type: closed end flexible Catheter size: 19 Gauge Catheter at skin depth: 11 cm Test dose: negative and Other  Assessment Sensory level: T9 Events: blood not aspirated, injection not painful, no injection resistance, negative IV test and no paresthesia  Additional Notes Reason for block:procedure for pain

## 2014-10-01 NOTE — MAU Note (Signed)
Pt presents with complaints of contractions that started this morning around 10am. Denies any vaginal bleeding or LOF

## 2014-10-01 NOTE — Progress Notes (Signed)
Subjective: Doing well, pain mild, UCs q2-3  Anesthesia none   Objective: BP 113/87 mmHg  Pulse 87  Temp(Src) 97.4 F (36.3 C) (Oral)  Resp 18  Ht 5\' 4"  (1.626 m)  Wt 77.565 kg (171 lb)  BMI 29.34 kg/m2  LMP 12/02/2013   FHT:  FHR: 140's bpm, variability: moderate,  accelerations:  Present,  decelerations:  Absent UC:   regular, every 2-3 minutes VE:   4/90/Vtx/-2 AROM clear AF   Assessment / Plan: Spontaneous labor, progressing normally  Fetal Wellbeing:  Category I Pain Control:  Epidural PRN  Anticipated MOD:  NSVD  Ruth Cole,MARIE-LYNE 10/01/2014, 3:39 PM

## 2014-10-02 ENCOUNTER — Inpatient Hospital Stay (HOSPITAL_COMMUNITY): Admission: RE | Admit: 2014-10-02 | Payer: 59 | Source: Ambulatory Visit

## 2014-10-02 LAB — CBC
HCT: 39.9 % (ref 36.0–46.0)
Hemoglobin: 13.2 g/dL (ref 12.0–15.0)
MCH: 30.7 pg (ref 26.0–34.0)
MCHC: 33.1 g/dL (ref 30.0–36.0)
MCV: 92.8 fL (ref 78.0–100.0)
Platelets: 191 10*3/uL (ref 150–400)
RBC: 4.3 MIL/uL (ref 3.87–5.11)
RDW: 13.9 % (ref 11.5–15.5)
WBC: 14.2 10*3/uL — ABNORMAL HIGH (ref 4.0–10.5)

## 2014-10-02 MED ORDER — INFLUENZA VAC SPLIT QUAD 0.5 ML IM SUSY: 0.5000 mL | PREFILLED_SYRINGE | INTRAMUSCULAR | Status: DC

## 2014-10-02 NOTE — Plan of Care (Signed)
Problem: Phase II Progression Outcomes Goal: Other Phase II Outcomes/Goals Outcome: Completed/Met Date Met:  10/02/14

## 2014-10-02 NOTE — Plan of Care (Signed)
Problem: Discharge Progression Outcomes Goal: Activity appropriate for discharge plan Outcome: Completed/Met Date Met:  10/02/14 Goal: Tolerating diet Outcome: Completed/Met Date Met:  10/02/14

## 2014-10-02 NOTE — Lactation Note (Signed)
This note was copied from the chart of Ruth Cole. Lactation Consultation Note  Baby is not latching well.  He gets onto the nipple and takes 3 sucks and detaches.  Mom has large flat nipples that dimple in the center.  His chin seems a bit recessed but when in the laid back nursing position it is less prominent.  Baby is eager to latch but is having difficulty grasping the breast.  He opens his mouth wide but his tongue is bowl shaped and in the center of his mouth.  It is difficult to get the nipple over his tongue.  A # 24 nipple shield was initiated on the right breast.  He seemed to latch and stay attached with the NS.  He suckled more and adjusted his latch a few times.  A couple of swallows were heard.  There was moisture in the shield when he detached.  We then placed him on the right breast but could not get the shield over his tongue.  He held it in his mouth and sucked but dimples were noted.  He flanges his bottom lip well though his top lip is limited in its ability to flange.  He was sleepy so we left him at the breast with the hopes that when he woke he attach better.  Plan is to initiated double electric breast pump if he does not start to eat consistently in the next 4 hours.  Mom will use a foley cup to feed him any expressed milk. Patient Name: Ruth Cole [id number] Date: 10/02/2014 Reason for consult: Initial assessment   Maternal Data Has patient been taught Hand Expression?: Yes Does the patient have breastfeeding experience prior to this delivery?: Yes  Feeding Feeding Type: Breast Fed  LATCH Score/Interventions Latch: Repeated attempts needed to sustain latch, nipple held in mouth throughout feeding, stimulation needed to elicit sucking reflex. Intervention(s): Skin to skin Intervention(s): Adjust position;Assist with latch  Audible Swallowing: None  Type of Nipple: Flat  Comfort (Breast/Nipple): Soft / non-tender     Hold (Positioning): Assistance  needed to correctly position infant at breast and maintain latch. Intervention(s): Support Pillows;Position options  LATCH Score: 5  Lactation Tools Discussed/Used Tools: Nipple Shields Nipple shield size: 24   Consult Status Consult Status: Follow-up Date: 10/03/14 Follow-up type: In-patient    Ruth Cole 10/02/2014, 8:35 PM

## 2014-10-02 NOTE — Plan of Care (Signed)
Problem: Consults Goal: Postpartum Patient Education (See Patient Education module for education specifics.)  Outcome: Completed/Met Date Met:  10/02/14 Goal: Skin Care Protocol Initiated - if Braden Score 18 or less If consults are not indicated, leave blank or document N/A  Outcome: Completed/Met Date Met:  10/02/14  Problem: Phase I Progression Outcomes Goal: Pain controlled with appropriate interventions Outcome: Completed/Met Date Met:  10/02/14 Goal: Voiding adequately Outcome: Completed/Met Date Met:  10/02/14 Goal: OOB as tolerated unless otherwise ordered Outcome: Completed/Met Date Met:  10/02/14 Goal: VS, stable, temp < 100.4 degrees F Outcome: Completed/Met Date Met:  10/02/14 Goal: Initial discharge plan identified Outcome: Completed/Met Date Met:  10/02/14 Goal: Other Phase I Outcomes/Goals Outcome: Completed/Met Date Met:  10/02/14  Problem: Phase II Progression Outcomes Goal: Pain controlled on oral analgesia Outcome: Completed/Met Date Met:  10/02/14 Goal: Progress activity as tolerated unless otherwise ordered Outcome: Completed/Met Date Met:  10/02/14 Goal: Afebrile, VS remain stable Outcome: Completed/Met Date Met:  10/02/14 Goal: Tolerating diet Outcome: Completed/Met Date Met:  10/02/14

## 2014-10-02 NOTE — Progress Notes (Signed)
PPD #1- SVD  Subjective:   Reports feeling well Tolerating po/ No nausea or vomiting Bleeding is light Pain controlled with Motrin Up ad lib / ambulatory / voiding without problems Newborn: breastfeeding  / Circumcision: not planning   Objective:   VS:  VS:  Filed Vitals:   10/01/14 2025 10/01/14 2125 10/02/14 0140 10/02/14 0900  BP: 132/76 123/67 136/72 127/65  Pulse: 78 78 75 73  Temp: 98.1 F (36.7 C) 98.1 F (36.7 C) 97.7 F (36.5 C) 97.8 F (36.6 C)  TempSrc: Oral Oral Oral Oral  Resp: 18 18 18 16   Height:      Weight:      SpO2: 98% 98% 98% 97%    LABS:  Recent Labs  10/01/14 1415 10/02/14 0550  WBC 12.2* 14.2*  HGB 14.3 13.2  PLT 232 191   Blood type: --/--/O POS (11/15 1415) Rubella: Immune (04/21 0000)   I&O: Intake/Output      11/15 0701 - 11/16 0700 11/16 0701 - 11/17 0700   Urine (mL/kg/hr) 250    Blood 300    Total Output 550     Net -550          Breastfed 1 x      Physical Exam: Alert and oriented x3 Abdomen: soft, non-tender, non-distended  Fundus: firm, non-tender, U-3 Perineum: Well approximated, no significant erythema, edema, or drainage; healing well. Lochia: small Extremities: No edema, no calf pain or tenderness    Assessment:  PPD #1 [id number]/ S/P:spontaneous vaginal, 2nd degree laceration  Doing well    Plan: Continue routine post partum orders Anticipate D/C home tomorrow   Julianne Handler, N MSN, CNM 10/02/2014, 10:38 AM

## 2014-10-03 ENCOUNTER — Ambulatory Visit: Payer: Self-pay

## 2014-10-03 MED ORDER — IBUPROFEN 600 MG PO TABS: 600.0000 mg | ORAL_TABLET | Freq: Four times a day (QID) | ORAL | Status: DC

## 2014-10-03 MED ORDER — OXYCODONE-ACETAMINOPHEN 5-325 MG PO TABS: 1.0000 | ORAL_TABLET | ORAL | Status: DC | PRN

## 2014-10-03 NOTE — Discharge Instructions (Signed)
Breast Pumping Tips °If you are breastfeeding, there may be times when you cannot feed your baby directly. Returning to work or going on a trip are common examples. Pumping allows you to store breast milk and feed it to your baby later.  °You may not get much milk when you first start to pump. Your breasts should start to make more after a few days. If you pump at the times you usually feed your baby, you may be able to keep making enough milk to feed your baby without also using formula. The more often you pump, the more milk you will produce.  °WHEN SHOULD I PUMP?  °· You can begin to pump soon after delivery. However, some experts recommend waiting about 4 weeks before giving your infant a bottle to make sure breastfeeding is going well.  °· If you plan to return to work, begin pumping a few weeks before. This will help you develop techniques that work best for you. It also lets you build up a supply of breast milk.   °· When you are with your infant, feed on demand and pump after each feeding.   °· When you are away from your infant for several hours, pump for about 15 minutes every 2-3 hours. Pump both breasts at the same time if you can.   °· If your infant has a formula feeding, make sure to pump around the same time.     °· If you drink any alcohol, wait 2 hours before pumping.   °HOW DO I PREPARE TO PUMP? °Your let-down reflex is the natural reaction to stimulation that makes your breast milk flow. It is easier to stimulate this reflex when you are relaxed. Find relaxation techniques that work for you. If you have difficulty with your let-down reflex, try these methods:  °· Smell one of your infant's blankets or an item of clothing.   °· Look at a picture or video of your infant.   °· Sit in a quiet, private space.   °· Massage the breast you plan to pump.   °· Place soothing warmth on the breast.   °· Play relaxing music.   °WHAT ARE SOME GENERAL BREAST PUMPING TIPS? °· Wash your hands before you pump. You  do not need to wash your nipples or breasts. °· There are three ways to pump. °¨ You can use your hand to massage and compress your breast. °¨ You can use a handheld manual pump. °¨ You can use an electric pump.   °· Make sure the suction cup (flange) on the breast pump is the right size. Place the flange directly over the nipple. If it is the wrong size or placed the wrong way, it may be painful and cause nipple damage.   °· If pumping is uncomfortable, apply a small amount of purified or modified lanolin to your nipple and areola. °· If you are using an electric pump, adjust the speed and suction power to be more comfortable. °· If pumping is painful or if you are not getting very much milk, you may need a different type of pump. A lactation consultant can help you determine what type of pump to use.   °· Keep a full water bottle near you at all times. Drinking lots of fluid helps you make more milk.  °· You can store your milk to use later. Pumped breast milk can be stored in a sealable, sterile container or plastic bag. Label all stored breast milk with the date you pumped it. °¨ Milk can stay out at room temperature for up to 8 hours. °¨   You can store your milk in the refrigerator for up to 8 days. °¨ You can store your milk in the freezer for 3 months. Thaw frozen milk using warm water. Do not put it in the microwave. °· Do not smoke. Smoking can lower your milk supply and harm your infant. If you need help quitting, ask your health care provider to recommend a program.   °WHEN SHOULD I CALL MY HEALTH CARE PROVIDER OR A LACTATION CONSULTANT? °· You are having trouble pumping. °· You are concerned that you are not making enough milk. °· You have nipple pain, soreness, or redness. °· You want to use birth control. Birth control pills may lower your milk supply. Talk to your health care provider about your options. °Document Released: 04/23/2010 Document Revised: 11/08/2013 Document Reviewed:  08/26/2013 °[employer]® Patient Information ©2015 [employer]. This information is not intended to replace advice given to you by your health care provider. Make sure you discuss any questions you have with your health care provider. ° °Breastfeeding °Deciding to breastfeed is one of the best choices you can make for you and your baby. A change in hormones during pregnancy causes your breast tissue to grow and increases the number and size of your milk ducts. These hormones also allow proteins, sugars, and fats from your blood supply to make breast milk in your milk-producing glands. Hormones prevent breast milk from being released before your baby is born as well as prompt milk flow after birth. Once breastfeeding has begun, thoughts of your baby, as well as his or her sucking or crying, can stimulate the release of milk from your milk-producing glands.  °BENEFITS OF BREASTFEEDING °For Your Baby °· Your first milk (colostrum) helps your baby's digestive system function better.   °· There are antibodies in your milk that help your baby fight off infections.   °· Your baby has a lower incidence of asthma, allergies, and sudden infant death syndrome.   °· The nutrients in breast milk are better for your baby than infant formulas and are designed uniquely for your baby's needs.   °· Breast milk improves your baby's brain development.   °· Your baby is less likely to develop other conditions, such as childhood obesity, asthma, or type 2 diabetes mellitus.   °For You  °· Breastfeeding helps to create a very special bond between you and your baby.   °· Breastfeeding is convenient. Breast milk is always available at the correct temperature and costs nothing.   °· Breastfeeding helps to burn calories and helps you lose the weight gained during pregnancy.   °· Breastfeeding makes your uterus contract to its prepregnancy size faster and slows bleeding (lochia) after you give birth.   °· Breastfeeding helps to lower your risk  of developing type 2 diabetes mellitus, osteoporosis, and breast or ovarian cancer later in life. °SIGNS THAT YOUR BABY IS HUNGRY °Early Signs of Hunger  °· Increased alertness or activity. °· Stretching. °· Movement of the head from side to side. °· Movement of the head and opening of the mouth when the corner of the mouth or cheek is stroked (rooting). °· Increased sucking sounds, smacking lips, cooing, sighing, or squeaking. °· Hand-to-mouth movements. °· Increased sucking of fingers or hands. °Late Signs of Hunger °· Fussing. °· Intermittent crying. °Extreme Signs of Hunger °Signs of extreme hunger will require calming and consoling before your baby will be able to breastfeed successfully. Do not wait for the following signs of extreme hunger to occur before you initiate breastfeeding:   °· Restlessness. °· A loud, strong   cry. °·  Screaming. °BREASTFEEDING BASICS °Breastfeeding Initiation °· Find a comfortable place to sit or lie down, with your neck and back well supported. °· Place a pillow or rolled up blanket under your baby to bring him or her to the level of your breast (if you are seated). Nursing pillows are specially designed to help support your arms and your baby while you breastfeed. °· Make sure that your baby's abdomen is facing your abdomen.   °· Gently massage your breast. With your fingertips, massage from your chest wall toward your nipple in a circular motion. This encourages milk flow. You may need to continue this action during the feeding if your milk flows slowly. °· Support your breast with 4 fingers underneath and your thumb above your nipple. Make sure your fingers are well away from your nipple and your baby's mouth.   °· Stroke your baby's lips gently with your finger or nipple.   °· When your baby's mouth is open wide enough, quickly bring your baby to your breast, placing your entire nipple and as much of the colored area around your nipple (areola) as possible into your baby's  mouth.   °¨ More areola should be visible above your baby's upper lip than below the lower lip.   °¨ Your baby's tongue should be between his or her lower gum and your breast.   °· Ensure that your baby's mouth is correctly positioned around your nipple (latched). Your baby's lips should create a seal on your breast and be turned out (everted). °· It is common for your baby to suck about 2-3 minutes in order to start the flow of breast milk. °Latching °Teaching your baby how to latch on to your breast properly is very important. An improper latch can cause nipple pain and decreased milk supply for you and poor weight gain in your baby. Also, if your baby is not latched onto your nipple properly, he or she may swallow some air during feeding. This can make your baby fussy. Burping your baby when you switch breasts during the feeding can help to get rid of the air. However, teaching your baby to latch on properly is still the best way to prevent fussiness from swallowing air while breastfeeding. °Signs that your baby has successfully latched on to your nipple:    °· Silent tugging or silent sucking, without causing you pain.   °· Swallowing heard between every 3-4 sucks.   °·  Muscle movement above and in front of his or her ears while sucking.   °Signs that your baby has not successfully latched on to nipple:  °· Sucking sounds or smacking sounds from your baby while breastfeeding. °· Nipple pain. °If you think your baby has not latched on correctly, slip your finger into the corner of your baby's mouth to break the suction and place it between your baby's gums. Attempt breastfeeding initiation again. °Signs of Successful Breastfeeding °Signs from your baby:   °· A gradual decrease in the number of sucks or complete cessation of sucking.   °· Falling asleep.   °· Relaxation of his or her body.   °· Retention of a small amount of milk in his or her mouth.   °· Letting go of your breast by himself or herself. °Signs  from you: °· Breasts that have increased in firmness, weight, and size 1-3 hours after feeding.   °· Breasts that are softer immediately after breastfeeding. °· Increased milk volume, as well as a change in milk consistency and color by the fifth day of breastfeeding.   °· Nipples that   are not sore, cracked, or bleeding. °Signs That Your Baby is Getting Enough Milk °· Wetting at least 3 diapers in a 24-hour period. The urine should be clear and pale yellow by age 5 days. °· At least 3 stools in a 24-hour period by age 5 days. The stool should be soft and yellow. °· At least 3 stools in a 24-hour period by age 7 days. The stool should be seedy and yellow. °· No loss of weight greater than 10% of birth weight during the first 3 days of age. °· Average weight gain of 4-7 ounces (113-198 g) per week after age 4 days. °· Consistent daily weight gain by age 5 days, without weight loss after the age of 2 weeks. °After a feeding, your baby may spit up a small amount. This is common. °BREASTFEEDING FREQUENCY AND DURATION °Frequent feeding will help you make more milk and can prevent sore nipples and breast engorgement. Breastfeed when you feel the need to reduce the fullness of your breasts or when your baby shows signs of hunger. This is called "breastfeeding on demand." Avoid introducing a pacifier to your baby while you are working to establish breastfeeding (the first 4-6 weeks after your baby is born). After this time you may choose to use a pacifier. Research has shown that pacifier use during the first year of a baby's life decreases the risk of sudden infant death syndrome (SIDS). °Allow your baby to feed on each breast as long as he or she wants. Breastfeed until your baby is finished feeding. When your baby unlatches or falls asleep while feeding from the first breast, offer the second breast. Because newborns are often sleepy in the first few weeks of life, you may need to awaken your baby to get him or her to  feed. °Breastfeeding times will vary from baby to baby. However, the following rules can serve as a guide to help you ensure that your baby is properly fed: °· Newborns (babies 4 weeks of age or younger) may breastfeed every 1-3 hours. °· Newborns should not go longer than 3 hours during the day or 5 hours during the night without breastfeeding. °· You should breastfeed your baby a minimum of 8 times in a 24-hour period until you begin to introduce solid foods to your baby at around 6 months of age. °BREAST MILK PUMPING °Pumping and storing breast milk allows you to ensure that your baby is exclusively fed your breast milk, even at times when you are unable to breastfeed. This is especially important if you are going back to work while you are still breastfeeding or when you are not able to be present during feedings. Your lactation consultant can give you guidelines on how long it is safe to store breast milk.  °A breast pump is a machine that allows you to pump milk from your breast into a sterile bottle. The pumped breast milk can then be stored in a refrigerator or freezer. Some breast pumps are operated by hand, while others use electricity. Ask your lactation consultant which type will work best for you. Breast pumps can be purchased, but some hospitals and breastfeeding support groups lease breast pumps on a monthly basis. A lactation consultant can teach you how to hand express breast milk, if you prefer not to use a pump.  °CARING FOR YOUR BREASTS WHILE YOU BREASTFEED °Nipples can become dry, cracked, and sore while breastfeeding. The following recommendations can help keep your breasts moisturized and healthy: °· Avoid   using soap on your nipples.   °· Wear a supportive bra. Although not required, special nursing bras and tank tops are designed to allow access to your breasts for breastfeeding without taking off your entire bra or top. Avoid wearing underwire-style bras or extremely tight bras. °· Air dry  your nipples for 3-4 minutes after each feeding.   °· Use only cotton bra pads to absorb leaked breast milk. Leaking of breast milk between feedings is normal.   °· Use lanolin on your nipples after breastfeeding. Lanolin helps to maintain your skin's normal moisture barrier. If you use pure lanolin, you do not need to wash it off before feeding your baby again. Pure lanolin is not toxic to your baby. You may also hand express a few drops of breast milk and gently massage that milk into your nipples and allow the milk to air dry. °In the first few weeks after giving birth, some women experience extremely full breasts (engorgement). Engorgement can make your breasts feel heavy, warm, and tender to the touch. Engorgement peaks within 3-5 days after you give birth. The following recommendations can help ease engorgement: °· Completely empty your breasts while breastfeeding or pumping. You may want to start by applying warm, moist heat (in the shower or with warm water-soaked hand towels) just before feeding or pumping. This increases circulation and helps the milk flow. If your baby does not completely empty your breasts while breastfeeding, pump any extra milk after he or she is finished. °· Wear a snug bra (nursing or regular) or tank top for 1-2 days to signal your body to slightly decrease milk production. °· Apply ice packs to your breasts, unless this is too uncomfortable for you. °· Make sure that your baby is latched on and positioned properly while breastfeeding. °If engorgement persists after 48 hours of following these recommendations, contact your health care provider or a lactation consultant. °OVERALL HEALTH CARE RECOMMENDATIONS WHILE BREASTFEEDING °· Eat healthy foods. Alternate between meals and snacks, eating 3 of each per day. Because what you eat affects your breast milk, some of the foods may make your baby more irritable than usual. Avoid eating these foods if you are sure that they are negatively  affecting your baby. °· Drink milk, fruit juice, and water to satisfy your thirst (about 10 glasses a day).   °· Rest often, relax, and continue to take your prenatal vitamins to prevent fatigue, stress, and anemia. °· Continue breast self-awareness checks. °· Avoid chewing and smoking tobacco. °· Avoid alcohol and drug use. °Some medicines that may be harmful to your baby can pass through breast milk. It is important to ask your health care provider before taking any medicine, including all over-the-counter and prescription medicine as well as vitamin and herbal supplements. °It is possible to become pregnant while breastfeeding. If birth control is desired, ask your health care provider about options that will be safe for your baby. °SEEK MEDICAL CARE IF:  °· You feel like you want to stop breastfeeding or have become frustrated with breastfeeding. °· You have painful breasts or nipples. °· Your nipples are cracked or bleeding. °· Your breasts are red, tender, or warm. °· You have a swollen area on either breast. °· You have a fever or chills. °· You have nausea or vomiting. °· You have drainage other than breast milk from your nipples. °· Your breasts do not become full before feedings by the fifth day after you give birth. °· You feel sad and depressed. °· Your baby is   too sleepy to eat well. °· Your baby is having trouble sleeping.   °· Your baby is wetting less than 3 diapers in a 24-hour period. °· Your baby has less than 3 stools in a 24-hour period. °· Your baby's skin or the white part of his or her eyes becomes yellow.   °· Your baby is not gaining weight by 5 days of age. °SEEK IMMEDIATE MEDICAL CARE IF:  °· Your baby is overly tired (lethargic) and does not want to wake up and feed. °· Your baby develops an unexplained fever. °Document Released: 11/03/2005 Document Revised: 11/08/2013 Document Reviewed: 04/27/2013 °[employer]® Patient Information ©2015 [employer]. This information is not intended to  replace advice given to you by your health care provider. Make sure you discuss any questions you have with your health care provider. ° °Nutrition for the New Mother  °A new mother needs good health and nutrition so she can have energy to take care of a new baby. Whether a mother breastfeeds or formula feeds the baby, it is important to have a well-balanced diet. Foods from all the food groups should be chosen to meet the new mother's energy needs and to give her the nutrients needed for repair and healing.  °A HEALTHY EATING PLAN °The My [employer] plan for Moms outlines what you should eat to help you and your baby stay healthy. The energy and amount of food you need depends on whether or not you are breastfeeding. If you are breastfeeding you will need more nutrients. If you choose not to breastfeed, your nutrition goal should be to return to a healthy weight. Limiting calories may be needed if you are not breastfeeding.  °HOME CARE INSTRUCTIONS  °For a personal plan based on your unique needs, see your Registered Dietitian or visit www.mypyramid.gov. °Eat a variety of foods. The plan below will help guide you. The following chart has a suggested daily meal plan from the [employer] for Moms. °Eat a variety of fruits and vegetables. °Eat more dark green and orange vegetables and cooked dried beans. °Make half your grains whole grains. Choose whole instead of refined grains. °Choose low-fat or lean meats and poultry. °Choose low-fat or fat-free dairy products like milk, cheese, or yogurt. °Fruits °Breastfeeding: 2 cups °Non-Breastfeeding: 2 cups °What Counts as a serving? °1 cup of fruit or juice. °½ cup dried fruit. °Vegetables °Breastfeeding: 3 cups °Non-Breastfeeding: 2 ½ cups °What Counts as a serving? °1 cup raw or cooked vegetables. °Juice or 2 cups raw leafy vegetables. °Grains °Breastfeeding: 8 oz °Non-Breastfeeding: 6 oz °What Counts as a serving? °1 slice bread. °1 oz ready-to-eat cereal. °½ cup cooked  pasta, rice, or cereal. °Meat and Beans °Breastfeeding: 6 ½ oz °Non-Breastfeeding: 5 ½ oz °What Counts as a serving? °1 oz lean meat, poultry, or fish °¼ cup cooked dry beans °½ oz nuts or 1 egg °1 tbs peanut butter °Milk °Breastfeeding: 3 cups °Non-Breastfeeding: 3 cups °What Counts as a serving? °1 cup milk. °8 oz yogurt. °1 ½ oz cheese. °2 oz processed cheese. °TIPS FOR THE BREASTFEEDING MOM °Rapid weight loss is not suggested when you are breastfeeding. By simply breastfeeding, you will be able to lose the weight gained during your pregnancy. Your caregiver can keep track of your weight and tell you if your weight loss is appropriate. °Be sure to drink fluids. You may notice that you are thirstier than usual. A suggestion is to drink a glass of water or other beverage whenever you breastfeed. °Avoid alcohol   as it can be passed into your breast milk. °Limit caffeine drinks to no more than 2 to 3 cups per day. °You may need to keep taking your prenatal vitamin while you are breastfeeding. Talk with your caregiver about taking a vitamin or supplement. °RETURING TO A HEALTHY WEIGHT °The [employer] Plan for Moms will help you return to a healthy weight. It will also provide the nutrients you need. °You may need to limit "empty" calories. These include: °High fat foods like fried foods, fatty meats, fast food, butter, and mayonnaise. °High sugar foods like sodas, jelly, candy, and sweets. °Be physically active. Include 30 minutes of exercise or more each day. Choose an activity you like such as walking, swimming, biking, or aerobics. Check with your caregiver before you start to exercise. °Document Released: 02/10/2008 Document Revised: 01/26/2012 Document Reviewed: 02/10/2008 °Exit[employer]® Patient Information ©2015 [employer]. This information is not intended to replace advice given to you by your health care provider. Make sure you discuss any questions you have with your health care provider. °Postpartum Depression  and Baby Blues °The postpartum period begins right after the birth of a baby. During this time, there is often a great amount of joy and excitement. It is also a time of many changes in the life of the parents. Regardless of how many times a mother gives birth, each child brings new challenges and dynamics to the family. It is not unusual to have feelings of excitement along with confusing shifts in moods, emotions, and thoughts. All mothers are at risk of developing postpartum depression or the "baby blues." These mood changes can occur right after giving birth, or they may occur many months after giving birth. The baby blues or postpartum depression can be mild or severe. Additionally, postpartum depression can go away rather quickly, or it can be a long-term condition.  °CAUSES °Raised hormone levels and the rapid drop in those levels are thought to be a main cause of postpartum depression and the baby blues. A number of hormones change during and after pregnancy. Estrogen and progesterone usually decrease right after the delivery of your baby. The levels of thyroid hormone and various cortisol steroids also rapidly drop. Other factors that play a role in these mood changes include major life events and genetics.  °RISK FACTORS °If you have any of the following risks for the baby blues or postpartum depression, know what symptoms to watch out for during the postpartum period. Risk factors that may increase the likelihood of getting the baby blues or postpartum depression include: °Having a personal or family history of depression.   °Having depression while being pregnant.   °Having premenstrual mood issues or mood issues related to oral contraceptives. °Having a lot of life stress.   °Having marital conflict.   °Lacking a social support network.   °Having a baby with special needs.   °Having health problems, such as diabetes.   °SIGNS AND SYMPTOMS °Symptoms of baby blues include: °Brief changes in mood, such as  going from extreme happiness to sadness. °Decreased concentration.   °Difficulty sleeping.   °Crying spells, tearfulness.   °Irritability.   °Anxiety.   °Symptoms of postpartum depression typically begin within the first month after giving birth. These symptoms include: °Difficulty sleeping or excessive sleepiness.   °Marked weight loss.   °Agitation.   °Feelings of worthlessness.   °Lack of interest in activity or food.   °Postpartum psychosis is a very serious condition and can be dangerous. Fortunately, it is rare. Displaying any of the following symptoms is cause for immediate medical attention. Symptoms of postpartum   psychosis include:  °Hallucinations and delusions.   °Bizarre or disorganized behavior.   °Confusion or disorientation.   °DIAGNOSIS  °A diagnosis is made by an evaluation of your symptoms. There are no medical or lab tests that lead to a diagnosis, but there are various questionnaires that a health care provider may use to identify those with the baby blues, postpartum depression, or psychosis. Often, a screening tool called the Edinburgh Postnatal Depression Scale is used to diagnose depression in the postpartum period.  °TREATMENT °The baby blues usually goes away on its own in 1-2 weeks. Social support is often all that is needed. You will be encouraged to get adequate sleep and rest. Occasionally, you may be given medicines to help you sleep.  °Postpartum depression requires treatment because it can last several months or longer if it is not treated. Treatment may include individual or group therapy, medicine, or both to address any social, physiological, and psychological factors that may play a role in the depression. Regular exercise, a healthy diet, rest, and social support may also be strongly recommended.  °Postpartum psychosis is more serious and needs treatment right away. Hospitalization is often needed. °HOME CARE INSTRUCTIONS °Get as much rest as you can. Nap when the baby sleeps.    °Exercise regularly. Some women find yoga and walking to be beneficial.   °Eat a balanced and nourishing diet.   °Do little things that you enjoy. Have a cup of tea, take a bubble bath, read your favorite magazine, or listen to your favorite music. °Avoid alcohol.   °Ask for help with household chores, cooking, grocery shopping, or running errands as needed. Do not try to do everything.   °Talk to people close to you about how you are feeling. Get support from your partner, family members, friends, or other new moms. °Try to stay positive in how you think. Think about the things you are grateful for.   °Do not spend a lot of time alone.   °Only take over-the-counter or prescription medicine as directed by your health care provider. °Keep all your postpartum appointments.   °Let your health care provider know if you have any concerns.   °SEEK MEDICAL CARE IF: °You are having a reaction to or problems with your medicine. °SEEK IMMEDIATE MEDICAL CARE IF: °You have suicidal feelings.   °You think you may harm the baby or someone else. °MAKE SURE YOU: °Understand these instructions. °Will watch your condition. °Will get help right away if you are not doing well or get worse. °Document Released: 08/07/2004 Document Revised: 11/08/2013 Document Reviewed: 08/15/2013 °ExitCare® Patient Information ©2015 [employer]. This information is not intended to replace advice given to you by your health care provider. Make sure you discuss any questions you have with your health care provider. °Breastfeeding and Mastitis °Mastitis is inflammation of the breast tissue. It can occur in women who are breastfeeding. This can make breastfeeding painful. Mastitis will sometimes go away on its own. Your health care provider will help determine if treatment is needed. °CAUSES °Mastitis is often associated with a blocked milk (lactiferous) duct. This can happen when too much milk builds up in the breast. Causes of excess milk in the breast  can include: °Poor latch-on. If your baby is not latched onto the breast properly, she or he may not empty your breast completely while breastfeeding. °Allowing too much time to pass between feedings. °Wearing a bra or other clothing that is too tight. This puts extra pressure on the lactiferous ducts so milk does not flow through them as it should. °Mastitis   can also be caused by a bacterial infection. Bacteria may enter the breast tissue through cuts or openings in the skin. In women who are breastfeeding, this may occur because of cracked or irritated skin. Cracks in the skin are often caused when your baby does not latch on properly to the breast. °SIGNS AND SYMPTOMS °Swelling, redness, tenderness, and pain in an area of the breast. °Swelling of the glands under the arm on the same side. °Fever may or may not accompany mastitis. °If an infection is allowed to progress, a collection of pus (abscess) may develop. °DIAGNOSIS  °Your health care provider can usually diagnose mastitis based on your symptoms and a physical exam. Tests may be done to help confirm the diagnosis. These may include: °Removal of pus from the breast by applying pressure to the area. This pus can be examined in the lab to determine which bacteria are present. If an abscess has developed, the fluid in the abscess can be removed with a needle. This can also be used to confirm the diagnosis and determine the bacteria present. In most cases, pus will not be present. °Blood tests to determine if your body is fighting a bacterial infection. °Mammogram or ultrasound tests to rule out other problems or diseases. °TREATMENT  °Mastitis that occurs with breastfeeding will sometimes go away on its own. Your health care provider may choose to wait 24 hours after first seeing you to decide whether a prescription medicine is needed. If your symptoms are worse after 24 hours, your health care provider will likely prescribe an antibiotic medicine to treat the  mastitis. He or she will determine which bacteria are most likely causing the infection and will then select an appropriate antibiotic medicine. This is sometimes changed based on the results of tests performed to identify the bacteria, or if there is no response to the antibiotic medicine selected. Antibiotic medicines are usually given by mouth. You may also be given medicine for pain. °HOME CARE INSTRUCTIONS °Only take over-the-counter or prescription medicines for pain, fever, or discomfort as directed by your health care provider. °If your health care provider prescribed an antibiotic medicine, take the medicine as directed. Make sure you finish it even if you start to feel better. °Do not wear a tight or underwire bra. Wear a soft, supportive bra. °Increase your fluid intake, especially if you have a fever. °Continue to empty the breast. Your health care provider can tell you whether this milk is safe for your infant or needs to be thrown out. You may be told to stop nursing until your health care provider thinks it is safe for your baby. Use a breast pump if you are advised to stop nursing. °Keep your nipples clean and dry. °Empty the first breast completely before going to the other breast. If your baby is not emptying your breasts completely for some reason, use a breast pump to empty your breasts. °If you go back to work, pump your breasts while at work to stay in time with your nursing schedule. °Avoid allowing your breasts to become overly filled with milk (engorged). °SEEK MEDICAL CARE IF: °You have pus-like discharge from the breast. °Your symptoms do not improve with the treatment prescribed by your health care provider within 2 days. °SEEK IMMEDIATE MEDICAL CARE IF: °Your pain and swelling are getting worse. °You have pain that is not controlled with medicine. °You have a red line extending from the breast toward your armpit. °You have a fever or persistent symptoms for   more than 2-3 days. °You have  a fever and your symptoms suddenly get worse. °MAKE SURE YOU:  °Understand these instructions. °Will watch your condition. °Will get help right away if you are not doing well or get worse. °Document Released: 02/28/2005 Document Revised: 11/08/2013 Document Reviewed: 06/09/2013 °ExitCare® Patient Information ©2015 [employer]. This information is not intended to replace advice given to you by your health care provider. Make sure you discuss any questions you have with your health care provider. ° °

## 2014-10-03 NOTE — Discharge Summary (Signed)
Obstetric Discharge Summary  Reason for Admission: Pt is a [id number] at [redacted]w[redacted]d admitted in active labor.  Patient has received care at [facility] since 9.5 wks, with Dr. Dellis Filbert as primary provider.  Medications on Admission: Prescriptions prior to admission  Medication Sig Dispense Refill Last Dose  . Prenatal Vit-Fe Fumarate-FA (PRENATAL MULTIVITAMIN) TABS tablet Take 1 tablet by mouth daily at 12 noon.   Past Week at Unknown time  . hydroxyprogesterone caproate (MAKENA) 250 mg/mL OIL injection Inject 250 mg into the muscle once a week.   04/21/2014 at Unknown time    Prenatal Labs: ABO, Rh: O POS (11/15 1415)  Antibody: NEG (11/15 1415) Rubella: Immune (04/21 0000)   RPR: NON REAC (11/15 1415)  HBsAg: Negative (04/21 0000)  HIV: Non-reactive (04/21 0000)  GTT : Normal GBS: Negative (10/16 0000)   Prenatal Procedures: ultrasound Intrapartum Course: Admitted in active labor @ 39.[redacted] wks gestation / epidural for pain management / rapid progression to complete dilation / SVD of viable female infant with 2nd degree perineal repair by Dr. Dellis Filbert / no immediate postpartum complications noted Intrapartum Procedures: spontaneous vaginal delivery Postpartum Procedures: none Complications-Operative and Postpartum: 2nd degree perineal laceration  Labs: HEMOGLOBIN  Date Value Ref Range Status  10/02/2014 13.2 12.0 - 15.0 g/dL Final   HCT  Date Value Ref Range Status  10/02/2014 39.9 36.0 - 46.0 % Final   Lab Results  Component Value Date   PLT 191 10/02/2014    Newborn Data: Live born female  Birth Weight: 8 lb 1.6 oz (3674 g) APGAR: 6, 8  Home with mother.   Discharge Information: Date: 10/03/2014 Discharge Diagnoses:  Pt is a [id number] at [redacted]w[redacted]d S/P Term Pregnancy-delivered on 10/01/2014  Condition: stable Activity: pelvic rest Diet: routine Medications:    Medication List    STOP taking these medications        MAKENA 250 mg/mL Oil injection  Generic drug:   hydroxyprogesterone caproate      TAKE these medications        ibuprofen 600 MG tablet  Commonly known as:  ADVIL,MOTRIN  Take 1 tablet (600 mg total) by mouth every 6 (six) hours.     oxyCODONE-acetaminophen 5-325 MG per tablet  Commonly known as:  PERCOCET/ROXICET  Take 1 tablet by mouth every 4 (four) hours as needed (for pain scale less than 7).     prenatal multivitamin Tabs tablet  Take 1 tablet by mouth daily at 12 noon.       Instructions: The [facility] OB/GYN instruction booklet has been given and reviewed Discharge to: home     Follow-up Information    Follow up with LAVOIE,MARIE-LYNE, MD. Schedule an appointment as soon as possible for a visit in 6 weeks.   Specialty:  Obstetrics and Gynecology   Why:  postpartum visit   Contact information:   [address] [place] [address] [place], [place], Jerilynn Mages, MSN, CNM 10/03/2014, 8:50 AM

## 2014-10-03 NOTE — Lactation Note (Signed)
This note was copied from the chart of [place]. Lactation Consultation Note  Mom reports that BF is going better since NS was introduced.  Baby is still having difficulty bringing the tongue over the gum line, it is bowl shaped with elevation and there is retraction with extension.  Because baby needs a NS, Mom is going to post pump 6 times a day for 10 minutes to protect her MS.  A follow-up appointment with lactation is scheduled for Friday.  Patient Name: Ruth Cole [id number] Date: 10/03/2014     Maternal Data    Feeding Feeding Type: Breast Fed  LATCH Score/Interventions Latch: Grasps breast easily, tongue down, lips flanged, rhythmical sucking.  Audible Swallowing: A few with stimulation  Type of Nipple: Flat  Comfort (Breast/Nipple): Soft / non-tender     Hold (Positioning): No assistance needed to correctly position infant at breast.  LATCH Score: 8  Lactation Tools Discussed/Used     Consult Status      Van Clines 10/03/2014, 12:19 PM

## 2014-10-03 NOTE — Plan of Care (Signed)
Problem: Discharge Progression Outcomes Goal: Barriers To Progression Addressed/Resolved Outcome: Completed/Met Date Met:  90/21/11 Goal: Complications resolved/controlled Outcome: Completed/Met Date Met:  10/03/14 Goal: Pain controlled with appropriate interventions Outcome: Completed/Met Date Met:  10/03/14

## 2014-10-03 NOTE — Progress Notes (Signed)
PPD #2 - SVD  Subjective:   Reports feeling well Tolerating po/ No nausea or vomiting Bleeding is light Pain controlled with Motrin / increased cramping with nursing requiring Percocet Up ad lib / ambulatory / voiding without problems Newborn: breastfeeding  / Circumcision: not planning   Objective:   VS:  VS:  Filed Vitals:   10/02/14 0140 10/02/14 0900 10/02/14 1310 10/03/14 0500  BP: 136/72 127/65 123/75 113/54  Pulse: 75 73 63 63  Temp: 97.7 F (36.5 C) 97.8 F (36.6 C) 98.1 F (36.7 C) 98.1 F (36.7 C)  TempSrc: Oral Oral Oral   Resp: 18 16 16 18   Height:      Weight:      SpO2: 98% 97% 98%     LABS:   Recent Labs  10/01/14 1415 10/02/14 0550  WBC 12.2* 14.2*  HGB 14.3 13.2  PLT 232 191   Blood type: --/--/O POS (11/15 1415) Rubella: Immune (04/21 0000)   I&O: Intake/Output      11/16 0701 - 11/17 0700 11/17 0701 - 11/18 0700   Urine (mL/kg/hr)     Blood     Total Output       Net              Physical Exam: Alert and oriented x3 Abdomen: soft, non-tender, non-distended  Fundus: firm, non-tender, U-3 Perineum: Well approximated, no significant erythema, edema, or drainage; healing well. Lochia: small Extremities: No edema, no calf pain or tenderness    Assessment:  PPD #2 / [id number] / S/P:spontaneous vaginal, 2nd degree laceration  Doing well    Plan: Continue routine post partum orders D/C home today  Laury Deep, M MSN, CNM 10/03/2014, 8:39 AM

## 2014-10-03 NOTE — Anesthesia Postprocedure Evaluation (Signed)
  Anesthesia Post-op Note  Patient: Ruth Cole  Procedure(s) Performed: * No procedures listed *  Patient Location: Mother/Baby  Anesthesia Type:Epidural  Level of Consciousness: awake, alert , oriented and patient cooperative  Airway and Oxygen Therapy: Patient Spontanous Breathing  Post-op Pain: mild  Post-op Assessment: Post-op Vital signs reviewed, Patient's Cardiovascular Status Stable, Respiratory Function Stable, Patent Airway, No signs of Nausea or vomiting, Adequate PO intake, Pain level controlled, No headache, No backache, No residual numbness and No residual motor weakness  Post-op Vital Signs: Reviewed and stable  Last Vitals:  Filed Vitals:   10/03/14 0500  BP: 113/54  Pulse: 63  Temp: 36.7 C  Resp: 18    Complications: No apparent anesthesia complications

## 2014-10-06 ENCOUNTER — Ambulatory Visit (HOSPITAL_COMMUNITY)
Admit: 2014-10-06 | Discharge: 2014-10-06 | Disposition: A | Discharge status: Home / Self Care | Payer: 59 | Attending: Obstetrics & Gynecology | Admitting: Obstetrics & Gynecology

## 2014-10-06 NOTE — Lactation Note (Signed)
Lactation Consult  Mother's reason for visit:  Follow up from hospital Visit Type:  Feeding assessment Appointment Notes:  ? Tongue restriction Consult:  Initial Lactation Consultant:  Ave Filter  ________________________________________________________________________    47 Name: Ruth Cole Date of Birth: 10/01/2014 Pediatrician: Burt Knack Gender: female Gestational Age: [redacted]w[redacted]d (At Birth) Birth Weight: 8 lb 1.6 oz (3674 g) Weight at Discharge: Weight: 7 lb 10.6 oz (3475 g)Date of Discharge: 10/03/2014 Filed Weights   10/01/14 1738 10/02/14 0130 10/02/14 2321  Weight: 8 lb 1.6 oz (3674 g) 7 lb 15 oz (3600 g) 7 lb 10.6 oz (3475 g)     Weight today: 7-10.7    ________________________________________________________________________  Mother's Name: Mauricio Po Type of delivery: Vaginal  Breastfeeding Experience: Breastfed 3 previous babies for 6-9 months Maternal Medical Conditions: None  ________________________________________________________________________  Breastfeeding History (Post Discharge)  Frequency of breastfeeding:  Every 2-3 hours Duration of feeding:  10+    Pumping  Type of pump:  Medela pump in style Frequency:  Prn for comfort Volume:  30-64ml  Infant Intake and Output Assessment  Voids:  6 in 24 hrs.  Color:  Clear yellow Stools:  3 in 24 hrs.  Color:  Yellow  ________________________________________________________________________  Maternal Breast Assessment  Breast:  Full and Compressible Nipple:  Erect    _______________________________________________________________________ Feeding Assessment/Evaluation  Mom and 43 day old baby here for feeding assist.  Baby had a difficult latch in the hospital and a nipple shield was used.  A tight posterior lingual frenulum was also noted prior to discharge.  Pediatrician referred baby to an ENT for revision which was done yesterday.  Mom states feedings have  gone better since revision and nipple shield not needed.  Revision site appears to be healing well.  Assisted mom with correct techniques for positioning and deep latch.  Baby did latch and nurse well for 20 minutes before coming off sleepy.  Baby transferred 20 mls.  Discussed with mom that it may take a few days for baby to become more effective with suck.  Instructed to continue to feed with cues and to pump breasts if poor feeding or for comfort as needed.  [facility] will weigh baby in 2 days.  Stressed importance of the need for LC follow up if any problems with sore nipples or baby's weight gain.  Encouraged breastfeeding support group.  Initial feeding assessment:  Infant's oral assessment:  Variance  Positioning:  Cross cradle Right breast/left breast  LATCH documentation:  Latch:  2 = Grasps breast easily, tongue down, lips flanged, rhythmical sucking.  Audible swallowing:  2 = Spontaneous and intermittent  Type of nipple:  2 = Everted at rest and after stimulation  Comfort (Breast/Nipple):  1 = Filling, red/small blisters or bruises, mild/mod discomfort  Hold (Positioning):  1 = Assistance needed to correctly position infant at breast and maintain latch  LATCH score:  8  Attached assessment:  Deep  Lips flanged:  No.  Lips untucked:  Yes.    Suck assessment:  Nutritive   I  Pre-feed weight:  3480 g  Post-feed weight:  3500 gAmount transferred:  20 ml Amount supplemented:  0 ml

## 2014-11-30 ENCOUNTER — Ambulatory Visit (HOSPITAL_COMMUNITY)
Admission: RE | Admit: 2014-11-30 | Discharge: 2014-11-30 | Disposition: A | Discharge status: Home / Self Care | Payer: 59 | Source: Ambulatory Visit | Attending: Family Medicine | Admitting: Family Medicine

## 2014-11-30 NOTE — Lactation Note (Signed)
Lactation Consult  Mother's reason for visit:  Latch feeding issues after exclusive pumping  Visit Type:  Feeding assessment  Appointment Notes:  Revised tongue tie , would like to assistance with re-latching - confirmed  Consult:  Followup  Lactation Consultant:  Ruth Cole  ________________________________________________________________________ Ruth Cole Name: Ruth Cole Date of Birth: 10/01/2014 Pediatrician: Ruth Cole  Gender: female Gestational Age: [redacted]w[redacted]d (At Birth) Birth Weight: 8 lb 1.6 oz (3674 g) Weight at Discharge: Weight: 7 lb 10.6 oz (3475 g)Date of Discharge: 10/03/2014 Filed Weights   10/01/14 1738 10/02/14 0130 10/02/14 2321  Weight: 8 lb 1.6 oz (3674 g) 7 lb 15 oz (3600 g) 7 lb 10.6 oz (3475 g)   Last weight taken from location outside of [facility]:  Location:Pediatrician's office Weight today: 11-13.6 oz 5376 g        ________________________________________________________________________  Mother's Name: Ruth Cole Type of delivery:   Breastfeeding Experience:  3 Kids , breast feed until 6-9 month  ________________________________________________________________________  Breastfeeding History (Post Discharge)  - per mom the baby had a frenulum ( anterior ) clipped , and in the mean time had to start supplementing. He got use to the bottle , but I have been working on re-latching, Have to return to work in a few weeks, so I will be doing both.   Frequency of breastfeeding: 1-2 X's a day  Duration of feeding: 10 -15 mins , each side ( does better on the left compared to the right )   Pumping -  Medela DEBP 6-7 x's in 24 hours , yielding 4-6 oz   Supplementing _ Only Breast milk   Infant Intake and Output Assessment  Voids:  7  in 24 hrs.  Color:  Clear yellow Stools:  5  in 24 hrs.  Color:  Yellow  ________________________________________________________________________  Maternal Breast  Assessment  Breast:  Full Nipple:  Erect Pain level:  0 Pain interventions:  Expressed breast milk  _______________________________________________________________________ Feeding Assessment/Evaluation  Initial feeding assessment:  Infant's oral assessment:  WNL  Positioning:  Cross cradle Left breast  LATCH documentation:  Latch:  2 = Grasps breast easily, tongue down, lips flanged, rhythmical sucking.  Audible swallowing:  2 = Spontaneous and intermittent  Type of nipple:  2 = Everted at rest and after stimulation  Comfort (Breast/Nipple):  1 = Filling, red/small blisters or bruises, mild/mod discomfort  Hold (Positioning):  2 = No assistance needed to correctly position infant at breast  [employer] [employer] score:9   Attached assessment:  Shallow  Lips flanged:  Yes.    Lips untucked:  Yes.    Suck assessment:  Nutritive  Tools:  None at consult  Instructed on use and cleaning of tool:  No   Pre-feed weight:  5376 g , 11-13.6 oz  Post-feed weight:  5454 g , 12.0.4 oz  Amount transferred:  78 ml  Amount supplemented:  None   Additional Feeding Assessment -   Infant's oral assessment:  WNL  Positioning:  Football Right breast  LATCH documentation:  Latch:  2 = Grasps breast easily, tongue down, lips flanged, rhythmical sucking.  Audible swallowing:  2 = Spontaneous and intermittent  Type of nipple:  2 = Everted at rest and after stimulation  Comfort (Breast/Nipple):  1 = Filling, red/small blisters or bruises, mild/mod discomfort  Hold (Positioning):  1 = Assistance needed to correctly position infant at breast and maintain latch  LATCH score: 8   Attached assessment:  Deep  Lips flanged:  Yes.    Lips untucked:  Yes.    Suck assessment:  Nutritive  Tools:  None  Instructed on use and cleaning of tool:  No  Wet changed   Pre-feed weight:  5414 g , 11.14.9 oz  Post-feed weight: 5426 g , 11.15.4 oz  Amount transferred:  12 ml  Amount supplemented:  None    Total  amount pumped post feed:  Mom did not pump   Total amount transferred:  90 ml  Total supplement given:  None ( mom knows feeding cues , and plans to feed at the  breast when she gets home or supplement with EBM.   Lactation Plan of Care :  Praised mom for her efforts breast feeding and pumping. Option #1 - Breast feed Ruth Cole at the breast ( offer both ) , if to fussy due to flow  Offer appetizer from a bottle and then trying to latch . May need to F/U with supplement.  Option #2 - feed on the left side ( side Ruth Cole ) the calmest to latch , and supplement after Post pump the right breast   Option #3 - [name] receives a bottle for the feeding and mom pumps 1`5 -2 0 mins

## 2014-12-11 ENCOUNTER — Ambulatory Visit (INDEPENDENT_AMBULATORY_CARE_PROVIDER_SITE_OTHER): Payer: 59 | Admitting: Family Medicine

## 2014-12-11 ENCOUNTER — Encounter: Payer: Self-pay | Admitting: Family Medicine

## 2014-12-11 VITALS — BP 115/78 | HR 92 | Temp 97.9°F | Ht 63.75 in | Wt 145.0 lb

## 2014-12-11 DIAGNOSIS — R000 Tachycardia, unspecified: Secondary | ICD-10-CM

## 2014-12-11 DIAGNOSIS — J069 Acute upper respiratory infection, unspecified: Secondary | ICD-10-CM

## 2014-12-11 DIAGNOSIS — Z0000 Encounter for general adult medical examination without abnormal findings: Secondary | ICD-10-CM

## 2014-12-11 DIAGNOSIS — K589 Irritable bowel syndrome without diarrhea: Secondary | ICD-10-CM

## 2014-12-11 DIAGNOSIS — J329 Chronic sinusitis, unspecified: Secondary | ICD-10-CM

## 2014-12-11 LAB — LIPID PANEL
Cholesterol: 156 mg/dL (ref 0–200)
HDL: 49.1 mg/dL (ref >39.00)
LDL Cholesterol: 82 mg/dL (ref 0–99)
NonHDL: 106.9
Total CHOL/HDL Ratio: 3
Triglycerides: 124 mg/dL (ref 0.0–149.0)
VLDL: 24.8 mg/dL (ref 0.0–40.0)

## 2014-12-11 LAB — CBC
HCT: 42 % (ref 36.0–46.0)
Hemoglobin: 14.2 g/dL (ref 12.0–15.0)
MCHC: 33.8 g/dL (ref 30.0–36.0)
MCV: 91.1 fl (ref 78.0–100.0)
Platelets: 323 10*3/uL (ref 150.0–400.0)
RBC: 4.61 Mil/uL (ref 3.87–5.11)
RDW: 13.4 % (ref 11.5–15.5)
WBC: 12 10*3/uL — ABNORMAL HIGH (ref 4.0–10.5)

## 2014-12-11 LAB — COMPREHENSIVE METABOLIC PANEL
ALT: 30 U/L (ref 0–35)
AST: 23 U/L (ref 0–37)
Albumin: 4.3 g/dL (ref 3.5–5.2)
Alkaline Phosphatase: 114 U/L (ref 39–117)
BUN: 12 mg/dL (ref 6–23)
CO2: 29 mEq/L (ref 19–32)
Calcium: 9.7 mg/dL (ref 8.4–10.5)
Chloride: 103 mEq/L (ref 96–112)
Creatinine, Ser: 0.64 mg/dL (ref 0.40–1.20)
GFR: 110.27 mL/min (ref >60.00)
Glucose, Bld: 92 mg/dL (ref 70–99)
Potassium: 4 mEq/L (ref 3.5–5.1)
Sodium: 140 mEq/L (ref 135–145)
Total Bilirubin: 0.8 mg/dL (ref 0.2–1.2)
Total Protein: 7.5 g/dL (ref 6.0–8.3)

## 2014-12-11 LAB — TSH: TSH: 0.77 u[IU]/mL (ref 0.35–4.50)

## 2014-12-11 MED ORDER — AMOXICILLIN 500 MG PO CAPS: 500.0000 mg | ORAL_CAPSULE | Freq: Three times a day (TID) | ORAL | Status: DC

## 2014-12-11 NOTE — Patient Instructions (Addendum)
[employer] Lavendar oil for fungas and Elderberry zinc liquid for colds    Preventive Care for Adults A healthy lifestyle and preventive care can promote health and wellness. Preventive health guidelines for women include the following key practices.  A routine yearly physical is a good way to check with your health care provider about your health and preventive screening. It is a chance to share any concerns and updates on your health and to receive a thorough exam.  Visit your dentist for a routine exam and preventive care every 6 months. Brush your teeth twice a day and floss once a day. Good oral hygiene prevents tooth decay and gum disease.  The frequency of eye exams is based on your age, health, family medical history, use of contact lenses, and other factors. Follow your health care provider's recommendations for frequency of eye exams.  Eat a healthy diet. Foods like vegetables, fruits, whole grains, low-fat dairy products, and lean protein foods contain the nutrients you need without too many calories. Decrease your intake of foods high in solid fats, added sugars, and salt. Eat the right amount of calories for you.Get information about a proper diet from your health care provider, if necessary.  Regular physical exercise is one of the most important things you can do for your health. Most adults should get at least 150 minutes of moderate-intensity exercise (any activity that increases your heart rate and causes you to sweat) each week. In addition, most adults need muscle-strengthening exercises on 2 or more days a week.  Maintain a healthy weight. The body mass index (BMI) is a screening tool to identify possible weight problems. It provides an estimate of body fat based on height and weight. Your health care provider can find your BMI and can help you achieve or maintain a healthy weight.For adults 20 years and older:  A BMI below 18.5 is considered  underweight.  A BMI of 18.5 to 24.9 is normal.  A BMI of 25 to 29.9 is considered overweight.  A BMI of 30 and above is considered obese.  Maintain normal blood lipids and cholesterol levels by exercising and minimizing your intake of saturated fat. Eat a balanced diet with plenty of fruit and vegetables. Blood tests for lipids and cholesterol should begin at age 41 and be repeated every 5 years. If your lipid or cholesterol levels are high, you are over 50, or you are at high risk for heart disease, you may need your cholesterol levels checked more frequently.Ongoing high lipid and cholesterol levels should be treated with medicines if diet and exercise are not working.  If you smoke, find out from your health care provider how to quit. If you do not use tobacco, do not start.  Lung cancer screening is recommended for adults aged 36-80 years who are at high risk for developing lung cancer because of a history of smoking. A yearly low-dose CT scan of the lungs is recommended for people who have at least a 30-pack-year history of smoking and are a current smoker or have quit within the past 15 years. A pack year of smoking is smoking an average of 1 pack of cigarettes a day for 1 year (for example: 1 pack a day for 30 years or 2 packs a day for 15 years). Yearly screening should continue until the smoker has stopped smoking for at least 15 years. Yearly screening should be stopped for people who develop a health problem that would prevent them from  having lung cancer treatment.  If you are pregnant, do not drink alcohol. If you are breastfeeding, be very cautious about drinking alcohol. If you are not pregnant and choose to drink alcohol, do not have more than 1 drink per day. One drink is considered to be 12 ounces (355 mL) of beer, 5 ounces (148 mL) of wine, or 1.5 ounces (44 mL) of liquor.  Avoid use of street drugs. Do not share needles with anyone. Ask for help if you need support or  instructions about stopping the use of drugs.  High blood pressure causes heart disease and increases the risk of stroke. Your blood pressure should be checked at least every 1 to 2 years. Ongoing high blood pressure should be treated with medicines if weight loss and exercise do not work.  If you are 45-46 years old, ask your health care provider if you should take aspirin to prevent strokes.  Diabetes screening involves taking a blood sample to check your fasting blood sugar level. This should be done once every 3 years, after age 54, if you are within normal weight and without risk factors for diabetes. Testing should be considered at a younger age or be carried out more frequently if you are overweight and have at least 1 risk factor for diabetes.  Breast cancer screening is essential preventive care for women. You should practice "breast self-awareness." This means understanding the normal appearance and feel of your breasts and may include breast self-examination. Any changes detected, no matter how small, should be reported to a health care provider. Women in their 75s and 30s should have a clinical breast exam (CBE) by a health care provider as part of a regular health exam every 1 to 3 years. After age 69, women should have a CBE every year. Starting at age 62, women should consider having a mammogram (breast X-ray test) every year. Women who have a family history of breast cancer should talk to their health care provider about genetic screening. Women at a high risk of breast cancer should talk to their health care providers about having an MRI and a mammogram every year.  Breast cancer gene (BRCA)-related cancer risk assessment is recommended for women who have family members with BRCA-related cancers. BRCA-related cancers include breast, ovarian, tubal, and peritoneal cancers. Having family members with these cancers may be associated with an increased risk for harmful changes (mutations) in  the breast cancer genes BRCA1 and BRCA2. Results of the assessment will determine the need for genetic counseling and BRCA1 and BRCA2 testing.  Routine pelvic exams to screen for cancer are no longer recommended for nonpregnant women who are considered low risk for cancer of the pelvic organs (ovaries, uterus, and vagina) and who do not have symptoms. Ask your health care provider if a screening pelvic exam is right for you.  If you have had past treatment for cervical cancer or a condition that could lead to cancer, you need Pap tests and screening for cancer for at least 20 years after your treatment. If Pap tests have been discontinued, your risk factors (such as having a new sexual partner) need to be reassessed to determine if screening should be resumed. Some women have medical problems that increase the chance of getting cervical cancer. In these cases, your health care provider may recommend more frequent screening and Pap tests.  The HPV test is an additional test that may be used for cervical cancer screening. The HPV test looks for the virus that  can cause the cell changes on the cervix. The cells collected during the Pap test can be tested for HPV. The HPV test could be used to screen women aged 48 years and older, and should be used in women of any age who have unclear Pap test results. After the age of 71, women should have HPV testing at the same frequency as a Pap test.  Colorectal cancer can be detected and often prevented. Most routine colorectal cancer screening begins at the age of 46 years and continues through age 1 years. However, your health care provider may recommend screening at an earlier age if you have risk factors for colon cancer. On a yearly basis, your health care provider may provide home test kits to check for hidden blood in the stool. Use of a small camera at the end of a tube, to directly examine the colon (sigmoidoscopy or colonoscopy), can detect the earliest forms  of colorectal cancer. Talk to your health care provider about this at age 73, when routine screening begins. Direct exam of the colon should be repeated every 5-10 years through age 47 years, unless early forms of pre-cancerous polyps or small growths are found.  People who are at an increased risk for hepatitis B should be screened for this virus. You are considered at high risk for hepatitis B if:  You were born in a country where hepatitis B occurs often. Talk with your health care provider about which countries are considered high risk.  Your parents were born in a high-risk country and you have not received a shot to protect against hepatitis B (hepatitis B vaccine).  You have HIV or AIDS.  You use needles to inject street drugs.  You live with, or have sex with, someone who has hepatitis B.  You get hemodialysis treatment.  You take certain medicines for conditions like cancer, organ transplantation, and autoimmune conditions.  Hepatitis C blood testing is recommended for all people born from [birth year] through 1965 and any individual with known risks for hepatitis C.  Practice safe sex. Use condoms and avoid high-risk sexual practices to reduce the spread of sexually transmitted infections (STIs). STIs include gonorrhea, chlamydia, syphilis, trichomonas, herpes, HPV, and human immunodeficiency virus (HIV). Herpes, HIV, and HPV are viral illnesses that have no cure. They can result in disability, cancer, and death.  You should be screened for sexually transmitted illnesses (STIs) including gonorrhea and chlamydia if:  You are sexually active and are younger than 24 years.  You are older than 24 years and your health care provider tells you that you are at risk for this type of infection.  Your sexual activity has changed since you were last screened and you are at an increased risk for chlamydia or gonorrhea. Ask your health care provider if you are at risk.  If you are at risk of  being infected with HIV, it is recommended that you take a prescription medicine daily to prevent HIV infection. This is called preexposure prophylaxis (PrEP). You are considered at risk if:  You are a heterosexual woman, are sexually active, and are at increased risk for HIV infection.  You take drugs by injection.  You are sexually active with a partner who has HIV.  Talk with your health care provider about whether you are at high risk of being infected with HIV. If you choose to begin PrEP, you should first be tested for HIV. You should then be tested every 3 months for as long as  you are taking PrEP.  Osteoporosis is a disease in which the bones lose minerals and strength with aging. This can result in serious bone fractures or breaks. The risk of osteoporosis can be identified using a bone density scan. Women ages 64 years and over and women at risk for fractures or osteoporosis should discuss screening with their health care providers. Ask your health care provider whether you should take a calcium supplement or vitamin D to reduce the rate of osteoporosis.  Menopause can be associated with physical symptoms and risks. Hormone replacement therapy is available to decrease symptoms and risks. You should talk to your health care provider about whether hormone replacement therapy is right for you.  Use sunscreen. Apply sunscreen liberally and repeatedly throughout the day. You should seek shade when your shadow is shorter than you. Protect yourself by wearing long sleeves, pants, a wide-brimmed hat, and sunglasses year round, whenever you are outdoors.  Once a month, do a whole body skin exam, using a mirror to look at the skin on your back. Tell your health care provider of new moles, moles that have irregular borders, moles that are larger than a pencil eraser, or moles that have changed in shape or color.  Stay current with required vaccines (immunizations).  Influenza vaccine. All adults  should be immunized every year.  Tetanus, diphtheria, and acellular pertussis (Td, Tdap) vaccine. Pregnant women should receive 1 dose of Tdap vaccine during each pregnancy. The dose should be obtained regardless of the length of time since the last dose. Immunization is preferred during the 27th-36th week of gestation. An adult who has not previously received Tdap or who does not know her vaccine status should receive 1 dose of Tdap. This initial dose should be followed by tetanus and diphtheria toxoids (Td) booster doses every 10 years. Adults with an unknown or incomplete history of completing a 3-dose immunization series with Td-containing vaccines should begin or complete a primary immunization series including a Tdap dose. Adults should receive a Td booster every 10 years.  Varicella vaccine. An adult without evidence of immunity to varicella should receive 2 doses or a second dose if she has previously received 1 dose. Pregnant females who do not have evidence of immunity should receive the first dose after pregnancy. This first dose should be obtained before leaving the health care facility. The second dose should be obtained 4-8 weeks after the first dose.  Human papillomavirus (HPV) vaccine. Females aged 13-26 years who have not received the vaccine previously should obtain the 3-dose series. The vaccine is not recommended for use in pregnant females. However, pregnancy testing is not needed before receiving a dose. If a female is found to be pregnant after receiving a dose, no treatment is needed. In that case, the remaining doses should be delayed until after the pregnancy. Immunization is recommended for any person with an immunocompromised condition through the age of 39 years if she did not get any or all doses earlier. During the 3-dose series, the second dose should be obtained 4-8 weeks after the first dose. The third dose should be obtained 24 weeks after the first dose and 16 weeks after  the second dose.  Zoster vaccine. One dose is recommended for adults aged 91 years or older unless certain conditions are present.  Measles, mumps, and rubella (MMR) vaccine. Adults born before [birth year] generally are considered immune to measles and mumps. Adults born in [birth year] or later should have 1 or more doses of MMR  vaccine unless there is a contraindication to the vaccine or there is laboratory evidence of immunity to each of the three diseases. A routine second dose of MMR vaccine should be obtained at least 28 days after the first dose for students attending postsecondary schools, health care workers, or international travelers. People who received inactivated measles vaccine or an unknown type of measles vaccine during 1963-1967 should receive 2 doses of MMR vaccine. People who received inactivated mumps vaccine or an unknown type of mumps vaccine before 1979 and are at high risk for mumps infection should consider immunization with 2 doses of MMR vaccine. For females of childbearing age, rubella immunity should be determined. If there is no evidence of immunity, females who are not pregnant should be vaccinated. If there is no evidence of immunity, females who are pregnant should delay immunization until after pregnancy. Unvaccinated health care workers born before [birth year] who lack laboratory evidence of measles, mumps, or rubella immunity or laboratory confirmation of disease should consider measles and mumps immunization with 2 doses of MMR vaccine or rubella immunization with 1 dose of MMR vaccine.  Pneumococcal 13-valent conjugate (PCV13) vaccine. When indicated, a person who is uncertain of her immunization history and has no record of immunization should receive the PCV13 vaccine. An adult aged 28 years or older who has certain medical conditions and has not been previously immunized should receive 1 dose of PCV13 vaccine. This PCV13 should be followed with a dose of pneumococcal polysaccharide (PPSV23)  vaccine. The PPSV23 vaccine dose should be obtained at least 8 weeks after the dose of PCV13 vaccine. An adult aged 47 years or older who has certain medical conditions and previously received 1 or more doses of PPSV23 vaccine should receive 1 dose of PCV13. The PCV13 vaccine dose should be obtained 1 or more years after the last PPSV23 vaccine dose.  Pneumococcal polysaccharide (PPSV23) vaccine. When PCV13 is also indicated, PCV13 should be obtained first. All adults aged 47 years and older should be immunized. An adult younger than age 83 years who has certain medical conditions should be immunized. Any person who resides in a nursing home or long-term care facility should be immunized. An adult smoker should be immunized. People with an immunocompromised condition and certain other conditions should receive both PCV13 and PPSV23 vaccines. People with human immunodeficiency virus (HIV) infection should be immunized as soon as possible after diagnosis. Immunization during chemotherapy or radiation therapy should be avoided. Routine use of PPSV23 vaccine is not recommended for American Indians, [place] Natives, or people younger than 65 years unless there are medical conditions that require PPSV23 vaccine. When indicated, people who have unknown immunization and have no record of immunization should receive PPSV23 vaccine. One-time revaccination 5 years after the first dose of PPSV23 is recommended for people aged 19-64 years who have chronic kidney failure, nephrotic syndrome, asplenia, or immunocompromised conditions. People who received 1-2 doses of PPSV23 before age 85 years should receive another dose of PPSV23 vaccine at age 57 years or later if at least 5 years have passed since the previous dose. Doses of PPSV23 are not needed for people immunized with PPSV23 at or after age 13 years.  Meningococcal vaccine. Adults with asplenia or persistent complement component deficiencies should receive 2 doses of  quadrivalent meningococcal conjugate (MenACWY-D) vaccine. The doses should be obtained at least 2 months apart. Microbiologists working with certain meningococcal bacteria, [place] recruits, people at risk during an outbreak, and people who travel to or live in  countries with a high rate of meningitis should be immunized. A first-year college student up through age 18 years who is living in a residence hall should receive a dose if she did not receive a dose on or after her 16th birthday. Adults who have certain high-risk conditions should receive one or more doses of vaccine.  Hepatitis A vaccine. Adults who wish to be protected from this disease, have certain high-risk conditions, work with hepatitis A-infected animals, work in hepatitis A research labs, or travel to or work in countries with a high rate of hepatitis A should be immunized. Adults who were previously unvaccinated and who anticipate close contact with an international adoptee during the first 60 days after arrival in the [place] from a country with a high rate of hepatitis A should be immunized.  Hepatitis B vaccine. Adults who wish to be protected from this disease, have certain high-risk conditions, may be exposed to blood or other infectious body fluids, are household contacts or sex partners of hepatitis B positive people, are clients or workers in certain care facilities, or travel to or work in countries with a high rate of hepatitis B should be immunized.  Haemophilus influenzae type b (Hib) vaccine. A previously unvaccinated person with asplenia or sickle cell disease or having a scheduled splenectomy should receive 1 dose of Hib vaccine. Regardless of previous immunization, a recipient of a hematopoietic stem cell transplant should receive a 3-dose series 6-12 months after her successful transplant. Hib vaccine is not recommended for adults with HIV infection. Preventive Services / Frequency Ages 75 to 75 years  Blood  pressure check.** / Every 1 to 2 years.  Lipid and cholesterol check.** / Every 5 years beginning at age 51.  Clinical breast exam.** / Every 3 years for women in their 54s and 36s.  BRCA-related cancer risk assessment.** / For women who have family members with a BRCA-related cancer (breast, ovarian, tubal, or peritoneal cancers).  Pap test.** / Every 2 years from ages 54 through 58. Every 3 years starting at age 54 through age 1 or 69 with a history of 3 consecutive normal Pap tests.  HPV screening.** / Every 3 years from ages 58 through ages 65 to 81 with a history of 3 consecutive normal Pap tests.  Hepatitis C blood test.** / For any individual with known risks for hepatitis C.  Skin self-exam. / Monthly.  Influenza vaccine. / Every year.  Tetanus, diphtheria, and acellular pertussis (Tdap, Td) vaccine.** / Consult your health care provider. Pregnant women should receive 1 dose of Tdap vaccine during each pregnancy. 1 dose of Td every 10 years.  Varicella vaccine.** / Consult your health care provider. Pregnant females who do not have evidence of immunity should receive the first dose after pregnancy.  HPV vaccine. / 3 doses over 6 months, if 44 and younger. The vaccine is not recommended for use in pregnant females. However, pregnancy testing is not needed before receiving a dose.  Measles, mumps, rubella (MMR) vaccine.** / You need at least 1 dose of MMR if you were born in 1957 or later. You may also need a 2nd dose. For females of childbearing age, rubella immunity should be determined. If there is no evidence of immunity, females who are not pregnant should be vaccinated. If there is no evidence of immunity, females who are pregnant should delay immunization until after pregnancy.  Pneumococcal 13-valent conjugate (PCV13) vaccine.** / Consult your health care provider.  Pneumococcal polysaccharide (PPSV23) vaccine.** /  1 to 2 doses if you smoke cigarettes or if you have certain  conditions.  Meningococcal vaccine.** / 1 dose if you are age 80 to 63 years and a Market researcher living in a residence hall, or have one of several medical conditions, you need to get vaccinated against meningococcal disease. You may also need additional booster doses.  Hepatitis A vaccine.** / Consult your health care provider.  Hepatitis B vaccine.** / Consult your health care provider.  Haemophilus influenzae type b (Hib) vaccine.** / Consult your health care provider. Ages 77 to 92 years  Blood pressure check.** / Every 1 to 2 years.  Lipid and cholesterol check.** / Every 5 years beginning at age 65 years.  Lung cancer screening. / Every year if you are aged 50-80 years and have a 30-pack-year history of smoking and currently smoke or have quit within the past 15 years. Yearly screening is stopped once you have quit smoking for at least 15 years or develop a health problem that would prevent you from having lung cancer treatment.  Clinical breast exam.** / Every year after age 2 years.  BRCA-related cancer risk assessment.** / For women who have family members with a BRCA-related cancer (breast, ovarian, tubal, or peritoneal cancers).  Mammogram.** / Every year beginning at age 85 years and continuing for as long as you are in good health. Consult with your health care provider.  Pap test.** / Every 3 years starting at age 55 years through age 41 or 79 years with a history of 3 consecutive normal Pap tests.  HPV screening.** / Every 3 years from ages 75 years through ages 53 to 77 years with a history of 3 consecutive normal Pap tests.  Fecal occult blood test (FOBT) of stool. / Every year beginning at age 59 years and continuing until age 70 years. You may not need to do this test if you get a colonoscopy every 10 years.  Flexible sigmoidoscopy or colonoscopy.** / Every 5 years for a flexible sigmoidoscopy or every 10 years for a colonoscopy beginning at age 97 years  and continuing until age 67 years.  Hepatitis C blood test.** / For all people born from [birth year] through 1965 and any individual with known risks for hepatitis C.  Skin self-exam. / Monthly.  Influenza vaccine. / Every year.  Tetanus, diphtheria, and acellular pertussis (Tdap/Td) vaccine.** / Consult your health care provider. Pregnant women should receive 1 dose of Tdap vaccine during each pregnancy. 1 dose of Td every 10 years.  Varicella vaccine.** / Consult your health care provider. Pregnant females who do not have evidence of immunity should receive the first dose after pregnancy.  Zoster vaccine.** / 1 dose for adults aged 72 years or older.  Measles, mumps, rubella (MMR) vaccine.** / You need at least 1 dose of MMR if you were born in 1957 or later. You may also need a 2nd dose. For females of childbearing age, rubella immunity should be determined. If there is no evidence of immunity, females who are not pregnant should be vaccinated. If there is no evidence of immunity, females who are pregnant should delay immunization until after pregnancy.  Pneumococcal 13-valent conjugate (PCV13) vaccine.** / Consult your health care provider.  Pneumococcal polysaccharide (PPSV23) vaccine.** / 1 to 2 doses if you smoke cigarettes or if you have certain conditions.  Meningococcal vaccine.** / Consult your health care provider.  Hepatitis A vaccine.** / Consult your health care provider.  Hepatitis B vaccine.** / Consult your  health care provider.  Haemophilus influenzae type b (Hib) vaccine.** / Consult your health care provider. Ages 75 years and over  Blood pressure check.** / Every 1 to 2 years.  Lipid and cholesterol check.** / Every 5 years beginning at age 45 years.  Lung cancer screening. / Every year if you are aged 66-80 years and have a 30-pack-year history of smoking and currently smoke or have quit within the past 15 years. Yearly screening is stopped once you have quit smoking  for at least 15 years or develop a health problem that would prevent you from having lung cancer treatment.  Clinical breast exam.** / Every year after age 42 years.  BRCA-related cancer risk assessment.** / For women who have family members with a BRCA-related cancer (breast, ovarian, tubal, or peritoneal cancers).  Mammogram.** / Every year beginning at age 68 years and continuing for as long as you are in good health. Consult with your health care provider.  Pap test.** / Every 3 years starting at age 4 years through age 69 or 75 years with 3 consecutive normal Pap tests. Testing can be stopped between 65 and 70 years with 3 consecutive normal Pap tests and no abnormal Pap or HPV tests in the past 10 years.  HPV screening.** / Every 3 years from ages 23 years through ages 67 or 65 years with a history of 3 consecutive normal Pap tests. Testing can be stopped between 65 and 70 years with 3 consecutive normal Pap tests and no abnormal Pap or HPV tests in the past 10 years.  Fecal occult blood test (FOBT) of stool. / Every year beginning at age 11 years and continuing until age 74 years. You may not need to do this test if you get a colonoscopy every 10 years.  Flexible sigmoidoscopy or colonoscopy.** / Every 5 years for a flexible sigmoidoscopy or every 10 years for a colonoscopy beginning at age 68 years and continuing until age 33 years.  Hepatitis C blood test.** / For all people born from [birth year] through 1965 and any individual with known risks for hepatitis C.  Osteoporosis screening.** / A one-time screening for women ages 67 years and over and women at risk for fractures or osteoporosis.  Skin self-exam. / Monthly.  Influenza vaccine. / Every year.  Tetanus, diphtheria, and acellular pertussis (Tdap/Td) vaccine.** / 1 dose of Td every 10 years.  Varicella vaccine.** / Consult your health care provider.  Zoster vaccine.** / 1 dose for adults aged 96 years or older.  Pneumococcal  13-valent conjugate (PCV13) vaccine.** / Consult your health care provider.  Pneumococcal polysaccharide (PPSV23) vaccine.** / 1 dose for all adults aged 68 years and older.  Meningococcal vaccine.** / Consult your health care provider.  Hepatitis A vaccine.** / Consult your health care provider.  Hepatitis B vaccine.** / Consult your health care provider.  Haemophilus influenzae type b (Hib) vaccine.** / Consult your health care provider. ** Family history and personal history of risk and conditions may change your health care provider's recommendations. Document Released: 12/30/2001 Document Revised: 03/20/2014 Document Reviewed: 03/31/2011 [facility] Patient Information 2015 [place], [place]. This information is not intended to replace advice given to you by your health care provider. Make sure you discuss any questions you have with your health care provider.

## 2014-12-11 NOTE — Progress Notes (Signed)
Patient ID: LAURIELLE SELMON, female   DOB: January 11, 1976, 39 y.o.   MRN: [id number]   ROSIA SYME  [id number] October 21, 1976 12/11/2014      Progress Note-Follow Up  Subjective  Chief Complaint  Chief Complaint  Patient presents with  . Annual Exam    non-fasting-no pap smear    HPI  Patient is a 39 y.o. female in today for routine medical care. Patient in for annual exam. Doing very well. Did deliver her last child just 2 months ago so is struggling with fatigue but no other notable concerns. Still has the trouble with her toenails at times but not interested in meds at this time. Has had some cold symptoms with congestion and PND for roughly a week but feels she is improving. No f/c. Her other 3 children 8, 6, 4 are adjusting well. Denies CP/palp/SOB/HA/congestion/fevers/GI or GU c/o. Taking meds as prescribed  Past Medical History  Diagnosis Date  . Chicken pox as a child  . Vaginal delivery     X 3  . H/O sinusitis     improved s/p surgical repair of deviated septum  . Urinary incontinence     s/p 3rd vaginal delivery, occasional  . Preventative health care 02/02/2012  . Insomnia 02/02/2012  . IBS (irritable bowel syndrome) 02/02/2012  . Allergy     seasonal  . PONV (postoperative nausea and vomiting)   . Onychomycosis 10/02/2013  . Miscarriage within last 12 months 12/12/2013  . Postpartum care following vaginal delivery (11/15) 10/01/2014    Past Surgical History  Procedure Laterality Date  . Mandible fracture surgery  2008  . Deviated septum repair  teenager  . Cleft palate repair  as a child  . Skin biopsy      multiple all benign  . Dilation and evacuation N/A 02/24/2013    Procedure: DILATATION AND EVACUATION;  Surgeon: Princess Bruins, MD;  Location: [facility];  Service: Gynecology;  Laterality: N/A;  . Dilation and evacuation N/A 08/24/2013    Procedure: DILATATION AND EVACUATION;  Surgeon: Princess Bruins, MD;  Location: [facility];  Service: Gynecology;   Laterality: N/A;    Family History  Problem Relation Age of Onset  . Hypertension Mother   . Hyperlipidemia Mother   . Diabetes Father     type 2  . Stroke Maternal Grandmother     few  . Cancer Maternal Grandfather     stomach  . Heart attack Maternal Grandfather     few  . Cancer Paternal Grandfather     lung- nonsmoker    History   Social History  . Marital Status: Married    Spouse Name: N/A    Number of Children: N/A  . Years of Education: N/A   Occupational History  . Not on file.   Social History Main Topics  . Smoking status: Never Smoker   . Smokeless tobacco: Never Used  . Alcohol Use: No  . Drug Use: No  . Sexual Activity:    Partners: Male     Comment: approx [redacted] wks gestation   Other Topics Concern  . Not on file   Social History Narrative    Current Outpatient Prescriptions on File Prior to Visit  Medication Sig Dispense Refill  . Prenatal Vit-Fe Fumarate-FA (PRENATAL MULTIVITAMIN) TABS tablet Take 1 tablet by mouth daily at 12 noon.     No current facility-administered medications on file prior to visit.    No Known Allergies  Review of Systems  Review  of Systems  Constitutional: Negative for fever, chills and malaise/fatigue.  HENT: Negative for congestion, hearing loss and nosebleeds.   Eyes: Negative for discharge.  Respiratory: Negative for cough, sputum production, shortness of breath and wheezing.   Cardiovascular: Negative for chest pain, palpitations and leg swelling.  Gastrointestinal: Negative for heartburn, nausea, vomiting, abdominal pain, diarrhea, constipation and blood in stool.  Genitourinary: Negative for dysuria, urgency, frequency and hematuria.  Musculoskeletal: Negative for myalgias, back pain and falls.  Skin: Negative for rash.  Neurological: Negative for dizziness, tremors, sensory change, focal weakness, loss of consciousness, weakness and headaches.  Endo/Heme/Allergies: Negative for polydipsia. Does not  bruise/bleed easily.  Psychiatric/Behavioral: Negative for depression and suicidal ideas. The patient is not nervous/anxious and does not have insomnia.     Objective  BP 115/78 mmHg  Pulse 108  Temp(Src) 97.9 F (36.6 C) (Oral)  Ht 5' 3.75" (1.619 m)  Wt 145 lb (65.772 kg)  BMI 25.09 kg/m2  SpO2 98%  Breastfeeding? Yes  Physical Exam  Physical Exam  Constitutional: She is oriented to person, place, and time and well-developed, well-nourished, and in no distress. No distress.  HENT:  Head: Normocephalic and atraumatic.  Right Ear: External ear normal.  Left Ear: External ear normal.  Nose: Nose normal.  Mouth/Throat: Oropharynx is clear and moist. No oropharyngeal exudate.  Eyes: Conjunctivae are normal. Pupils are equal, round, and reactive to light. Right eye exhibits no discharge. Left eye exhibits no discharge. No scleral icterus.  Neck: Normal range of motion. Neck supple. No thyromegaly present.  Cardiovascular: Normal rate, regular rhythm, normal heart sounds and intact distal pulses.   No murmur heard. Pulmonary/Chest: Effort normal and breath sounds normal. No respiratory distress. She has no wheezes. She has no rales.  Abdominal: Soft. Bowel sounds are normal. She exhibits no distension and no mass. There is no tenderness.  Musculoskeletal: Normal range of motion. She exhibits no edema or tenderness.  Lymphadenopathy:    She has no cervical adenopathy.  Neurological: She is alert and oriented to person, place, and time. She has normal reflexes. No cranial nerve deficit. Coordination normal.  Skin: Skin is warm and dry. No rash noted. She is not diaphoretic.  Psychiatric: Mood, memory and affect normal.    Lab Results  Component Value Date   TSH 0.862 12/08/2013   Lab Results  Component Value Date   WBC 14.2* 10/02/2014   HGB 13.2 10/02/2014   HCT 39.9 10/02/2014   MCV 92.8 10/02/2014   PLT 191 10/02/2014   Lab Results  Component Value Date   CREATININE  0.54 12/08/2013   BUN 10 12/08/2013   NA 139 12/08/2013   K 4.6 12/08/2013   CL 102 12/08/2013   CO2 29 12/08/2013   Lab Results  Component Value Date   ALT 14 12/08/2013   AST 16 12/08/2013   ALKPHOS 53 12/08/2013   BILITOT 1.0 12/08/2013   Lab Results  Component Value Date   CHOL 137 12/08/2013   Lab Results  Component Value Date   HDL 41 12/08/2013   Lab Results  Component Value Date   LDLCALC 71 12/08/2013   Lab Results  Component Value Date   TRIG 125 12/08/2013   Lab Results  Component Value Date   CHOLHDL 3.3 12/08/2013     Assessment & Plan   IBS (irritable bowel syndrome) Encouraged increased hydration and fiber in diet. Daily probiotics. Add a fiber supplement, avoid offending foods.   Preventative health care Patient encouraged  to maintain heart healthy diet, regular exercise, adequate sleep. Consider daily probiotics. Take medications as prescribed. Labs ordered and reviewed, leukocytosis improved but not resolved will monitor   Acute upper respiratory infection Encouraged increased rest and hydration, add probiotics, zinc such as Coldeze or Xicam. Treat fevers as needed. Mucinex bid and given an rx for Amox if worsens

## 2014-12-11 NOTE — Progress Notes (Signed)
Pre visit review using our clinic review tool, if applicable. No additional management support is needed unless otherwise documented below in the visit note. 

## 2014-12-17 ENCOUNTER — Encounter: Payer: Self-pay | Admitting: Family Medicine

## 2014-12-17 NOTE — Assessment & Plan Note (Signed)
Patient encouraged to maintain heart healthy diet, regular exercise, adequate sleep. Consider daily probiotics. Take medications as prescribed. Labs ordered and reviewed, leukocytosis improved but not resolved will monitor

## 2014-12-17 NOTE — Assessment & Plan Note (Signed)
Encouraged increased hydration and fiber in diet. Daily probiotics. Add a fiber supplement, avoid offending foods.

## 2014-12-17 NOTE — Assessment & Plan Note (Signed)
Encouraged increased rest and hydration, add probiotics, zinc such as Coldeze or Xicam. Treat fevers as needed. Mucinex bid and given an rx for Amox if worsens

## 2015-09-07 ENCOUNTER — Encounter: Payer: Self-pay | Admitting: Medical

## 2015-09-07 ENCOUNTER — Ambulatory Visit (INDEPENDENT_AMBULATORY_CARE_PROVIDER_SITE_OTHER): Payer: 59 | Admitting: Medical

## 2015-09-07 VITALS — BP 98/60 | HR 90 | Temp 98.2°F | Ht 63.75 in | Wt 138.8 lb

## 2015-09-07 DIAGNOSIS — J029 Acute pharyngitis, unspecified: Secondary | ICD-10-CM | POA: Diagnosis not present

## 2015-09-07 LAB — POCT RAPID STREP A (OFFICE): Rapid Strep A Screen: NEGATIVE

## 2015-09-07 MED ORDER — CEFTRIAXONE SODIUM 1 G IJ SOLR
1.0000 g | Freq: Once | INTRAMUSCULAR | Status: AC
  Administered 2015-09-07: 1 g via INTRAMUSCULAR

## 2015-09-07 MED ORDER — FLUCONAZOLE 150 MG PO TABS
150.0000 mg | ORAL_TABLET | Freq: Once | ORAL | Status: DC
Start: 2015-09-07 — End: 2015-12-19

## 2015-09-07 MED ORDER — AMOXICILLIN-POT CLAVULANATE 875-125 MG PO TABS: 1.0000 | ORAL_TABLET | Freq: Two times a day (BID) | ORAL | Status: DC

## 2015-09-07 MED ORDER — AMOXICILLIN 875 MG PO TABS: 875.0000 mg | ORAL_TABLET | Freq: Two times a day (BID) | ORAL | Status: DC

## 2015-09-07 NOTE — Patient Instructions (Addendum)
Your strep test was negative. However, your physical exam and clinical presentation is suspicious for strep and it is important to note that rapid strep test can be falsely negative. So I am going to give you amoxicillin antibiotic today based on your exam and clinical presentation.  Based on the degree of redness, swelling and lymph nodes enlargement,  I also decided to treat you more aggressive in light of the upcoming weekend.So gave rocephin 1 gram im.  Rest hydrate, tylenol for fever, and warm salt water gargles.   Follow up in 7 days or as needed.

## 2015-09-07 NOTE — Progress Notes (Signed)
Subjective:    Patient ID: Ruth Cole, female    DOB: 11/16/1976, 39 y.o.   MRN: [id number]  HPI Pt states she has sweats and chills. Pt states taking tylenol and naprroxyn.  Pt states severe st. Lt describes lymph nodes feel swollen.Some body aches diffuse.   Pt states she has 4 children. All 8 and younger. She works as Software engineer in [place]  Constitutional: Positive for chills, diaphoresis and fatigue. Negative for fever.  Respiratory: Negative for cough, choking, chest tightness, shortness of breath and wheezing.   Cardiovascular: Negative for chest pain and palpitations.  Musculoskeletal: Positive for myalgias.  Neurological: Positive for headaches. Negative for dizziness, seizures, syncope, speech difficulty, weakness and numbness.  Hematological: Positive for adenopathy. Does not bruise/bleed easily.  Psychiatric/Behavioral: Negative for behavioral problems and confusion.     Past Medical History  Diagnosis Date  . Chicken pox as a child  . Vaginal delivery     X 3  . H/O sinusitis     improved s/p surgical repair of deviated septum  . Urinary incontinence     s/p 3rd vaginal delivery, occasional  . Preventative health care 02/02/2012  . Insomnia 02/02/2012  . IBS (irritable bowel syndrome) 02/02/2012  . Allergy     seasonal  . PONV (postoperative nausea and vomiting)   . Onychomycosis 10/02/2013  . Miscarriage within last 12 months 12/12/2013  . Postpartum care following vaginal delivery (11/15) 10/01/2014  . Acute upper respiratory infection 11/22/2013    Social History   Social History  . Marital Status: Married    Spouse Name: N/A  . Number of Children: N/A  . Years of Education: N/A   Occupational History  . Not on file.   Social History Main Topics  . Smoking status: Never Smoker   . Smokeless tobacco: Never Used  . Alcohol Use: No  . Drug Use: No  . Sexual Activity:    Partners: Male     Comment: approx [redacted] wks  gestation   Other Topics Concern  . Not on file   Social History Narrative    Past Surgical History  Procedure Laterality Date  . Mandible fracture surgery  2008  . Deviated septum repair  teenager  . Cleft palate repair  as a child  . Skin biopsy      multiple all benign  . Dilation and evacuation N/A 02/24/2013    Procedure: DILATATION AND EVACUATION;  Surgeon: Princess Bruins, MD;  Location: [facility];  Service: Gynecology;  Laterality: N/A;  . Dilation and evacuation N/A 08/24/2013    Procedure: DILATATION AND EVACUATION;  Surgeon: Princess Bruins, MD;  Location: [facility];  Service: Gynecology;  Laterality: N/A;    Family History  Problem Relation Age of Onset  . Hypertension Mother   . Hyperlipidemia Mother   . Diabetes Father     type 2  . Kidney disease Father   . Alcohol abuse Father   . Stroke Maternal Grandmother     few  . Cancer Maternal Grandfather     stomach  . Heart attack Maternal Grandfather     few  . Cancer Paternal Grandfather     lung- nonsmoker    No Known Allergies  Current Outpatient Prescriptions on File Prior to Visit  Medication Sig Dispense Refill  . Prenatal Vit-Fe Fumarate-FA (PRENATAL MULTIVITAMIN) TABS tablet Take 1 tablet by mouth daily at 12 noon.     No current facility-administered medications  on file prior to visit.    BP 98/60 mmHg  Pulse 90  Temp(Src) 98.2 F (36.8 C) (Oral)  Ht 5' 3.75" (1.619 m)  Wt 138 lb 12.8 oz (62.959 kg)  BMI 24.02 kg/m2  SpO2 98%       Objective:   Physical Exam  General  Mental Status - Alert. General Appearance - Well groomed. Not in acute distress.  Skin Rashes- No Rashes.  HEENT Head- Normal. Ear Auditory Canal - Left- Normal. Right - Normal.Tympanic Membrane- Left- Normal. Right- Normal. Eye Sclera/Conjunctiva- Left- Normal. Right- Normal. Nose & Sinuses Nasal Mucosa- Left-  Boggy and Congested. Right-  Boggy and  Congested.Bilateral no  maxillary and  No frontal sinus  pressure. Mouth & Throat Lips: Upper Lip- Normal: no dryness, cracking, pallor, cyanosis, or vesicular eruption. Lower Lip-Normal: no dryness, cracking, pallor, cyanosis or vesicular eruption. Buccal Mucosa- Bilateral- No Aphthous ulcers. Oropharynx- No Discharge but  Erythema. Tonsils: Characteristics- Bilateral- very bright  Erythema and  Congestion. Size/Enlargement- Bilateral-  2+ enlargement. Discharge- bilateral-None. On inspection and palpation no swelling floor of the mouth  Neck Neck- Supple. No Masses. Moderate submandibular nodes swollen.   Chest and Lung Exam Auscultation: Breath Sounds:-Clear even and unlabored.  Cardiovascular Auscultation:Rythm- Regular, rate and rhythm. Murmurs & Other Heart Sounds:Ausculatation of the heart reveal- No Murmurs.  Lymphatic Head & Neck General Head & Neck Lymphatics: Bilateral: Description- No Localized lymphadenopathy.       Assessment & Plan:  Your strep test was negative. However, your physical exam and clinical presentation is suspicious for strep and it is important to note that rapid strep test can be falsely negative. So I am going to give you amoxicillin antibiotic today based on your exam and clinical presentation.  Based on the degree of redness, swelling and lymph nodes enlargement,  I also decided to treat you more aggressive in light of the upcoming weekend.So gave rocephin 1 gram im.  Rest hydrate, tylenol for fever, and warm salt water gargles.   Follow up in 7 days or as needed.   I did rx diflucan 1 tab that could use in event of yeast infection while on antibiotics.

## 2015-09-07 NOTE — Progress Notes (Signed)
Pre visit review using our clinic review tool, if applicable. No additional management support is needed unless otherwise documented below in the visit note. 

## 2015-09-07 NOTE — Addendum Note (Signed)
Addended by: Tasia Catchings on: 09/07/2015 12:47 PM   Modules accepted: Orders

## 2015-10-16 ENCOUNTER — Telehealth: Payer: Self-pay | Admitting: Family Medicine

## 2015-10-16 NOTE — Telephone Encounter (Signed)
Documented flu shot

## 2015-10-16 NOTE — Telephone Encounter (Signed)
Per pt she got flu shot at work 08/2015

## 2015-12-13 ENCOUNTER — Encounter: Payer: 59 | Admitting: Family Medicine

## 2015-12-18 ENCOUNTER — Telehealth: Payer: Self-pay | Admitting: Behavioral Health

## 2015-12-18 NOTE — Telephone Encounter (Signed)
Unable to reach patient at time of Pre-Visit Call.  Left message for patient to return call when available.    

## 2015-12-19 ENCOUNTER — Ambulatory Visit (INDEPENDENT_AMBULATORY_CARE_PROVIDER_SITE_OTHER): Payer: 59 | Admitting: Family Medicine

## 2015-12-19 ENCOUNTER — Encounter: Payer: Self-pay | Admitting: Family Medicine

## 2015-12-19 VITALS — BP 101/67 | HR 87 | Temp 98.3°F | Ht 64.0 in | Wt 145.8 lb

## 2015-12-19 DIAGNOSIS — Z0000 Encounter for general adult medical examination without abnormal findings: Secondary | ICD-10-CM

## 2015-12-19 DIAGNOSIS — J069 Acute upper respiratory infection, unspecified: Secondary | ICD-10-CM

## 2015-12-19 DIAGNOSIS — R1013 Epigastric pain: Secondary | ICD-10-CM

## 2015-12-19 DIAGNOSIS — F418 Other specified anxiety disorders: Secondary | ICD-10-CM | POA: Diagnosis not present

## 2015-12-19 DIAGNOSIS — F329 Major depressive disorder, single episode, unspecified: Secondary | ICD-10-CM | POA: Diagnosis not present

## 2015-12-19 DIAGNOSIS — F32A Depression, unspecified: Secondary | ICD-10-CM

## 2015-12-19 LAB — COMPREHENSIVE METABOLIC PANEL
ALT: 10 U/L (ref 0–35)
AST: 15 U/L (ref 0–37)
Albumin: 3.8 g/dL (ref 3.5–5.2)
Alkaline Phosphatase: 52 U/L (ref 39–117)
BUN: 9 mg/dL (ref 6–23)
CO2: 30 mEq/L (ref 19–32)
Calcium: 8.9 mg/dL (ref 8.4–10.5)
Chloride: 104 mEq/L (ref 96–112)
Creatinine, Ser: 0.57 mg/dL (ref 0.40–1.20)
GFR: 125.37 mL/min (ref >60.00)
Glucose, Bld: 94 mg/dL (ref 70–99)
Potassium: 3.9 mEq/L (ref 3.5–5.1)
Sodium: 140 mEq/L (ref 135–145)
Total Bilirubin: 0.4 mg/dL (ref 0.2–1.2)
Total Protein: 6.6 g/dL (ref 6.0–8.3)

## 2015-12-19 LAB — CBC
HCT: 40.8 % (ref 36.0–46.0)
Hemoglobin: 13.7 g/dL (ref 12.0–15.0)
MCHC: 33.5 g/dL (ref 30.0–36.0)
MCV: 91.7 fl (ref 78.0–100.0)
Platelets: 307 10*3/uL (ref 150.0–400.0)
RBC: 4.45 Mil/uL (ref 3.87–5.11)
RDW: 12.9 % (ref 11.5–15.5)
WBC: 8.2 10*3/uL (ref 4.0–10.5)

## 2015-12-19 LAB — TSH: TSH: 1.02 u[IU]/mL (ref 0.35–4.50)

## 2015-12-19 LAB — LIPID PANEL
Cholesterol: 99 mg/dL (ref 0–200)
HDL: 34.5 mg/dL — ABNORMAL LOW (ref >39.00)
LDL Cholesterol: 28 mg/dL (ref 0–99)
NonHDL: 64.82
Total CHOL/HDL Ratio: 3
Triglycerides: 184 mg/dL — ABNORMAL HIGH (ref 0.0–149.0)
VLDL: 36.8 mg/dL (ref 0.0–40.0)

## 2015-12-19 MED ORDER — ESCITALOPRAM OXALATE 10 MG PO TABS: 10.0000 mg | ORAL_TABLET | Freq: Every day | ORAL | Status: DC

## 2015-12-19 MED FILL — ESCITALOPRAM 10 MG TABLET: 10 | 30 days supply | Qty: 30 | Fill #0

## 2015-12-19 NOTE — Progress Notes (Signed)
Pre visit review using our clinic review tool, if applicable. No additional management support is needed unless otherwise documented below in the visit note. 

## 2015-12-19 NOTE — Progress Notes (Signed)
Subjective:    Patient ID: Ruth Cole, female    DOB: Dec 14, 1975, 40 y.o.   MRN: [id number]  Chief Complaint  Patient presents with  . Annual Exam    HPI Patient is in today for annual exam and discuss some concerns. She is in good physical health but she does note some increased dyspepsia. No nausea or vomitting. Also notes a great deal of stress in the past  2 years. Notes her father has died and she has several young children. She admits she is struggling with fatigue and anhedonia. No suicidal ideation but she does acknowledge anxiety and low mood. Denies CP/palp/SOB/HA/congestion/fevers/GI or GU c/o. Taking meds as prescribed  Past Medical History  Diagnosis Date  . Chicken pox as a child  . Vaginal delivery     X 3  . H/O sinusitis     improved s/p surgical repair of deviated septum  . Urinary incontinence     s/p 3rd vaginal delivery, occasional  . Preventative health care 02/02/2012  . Insomnia 02/02/2012  . IBS (irritable bowel syndrome) 02/02/2012  . Allergy     seasonal  . PONV (postoperative nausea and vomiting)   . Onychomycosis 10/02/2013  . Miscarriage within last 12 months 12/12/2013  . Postpartum care following vaginal delivery (11/15) 10/01/2014  . Acute upper respiratory infection 11/22/2013  . History of chicken pox   . Depression with anxiety 12/23/2015    Past Surgical History  Procedure Laterality Date  . Mandible fracture surgery  2008  . Deviated septum repair  teenager  . Cleft palate repair  as a child  . Skin biopsy      multiple all benign  . Dilation and evacuation N/A 02/24/2013    Procedure: DILATATION AND EVACUATION;  Surgeon: Princess Bruins, MD;  Location: [facility];  Service: Gynecology;  Laterality: N/A;  . Dilation and evacuation N/A 08/24/2013    Procedure: DILATATION AND EVACUATION;  Surgeon: Princess Bruins, MD;  Location: [facility];  Service: Gynecology;  Laterality: N/A;    Family History  Problem Relation Age of Onset  .  Hypertension Mother   . Hyperlipidemia Mother   . Diabetes Father     type 2  . Kidney disease Father   . Alcohol abuse Father   . Stroke Maternal Grandmother     few  . Cancer Maternal Grandfather     stomach  . Heart attack Maternal Grandfather     few  . Cancer Paternal Grandfather     lung- nonsmoker    Social History   Social History  . Marital Status: Married    Spouse Name: N/A  . Number of Children: N/A  . Years of Education: N/A   Occupational History  . Not on file.   Social History Main Topics  . Smoking status: Never Smoker   . Smokeless tobacco: Never Used  . Alcohol Use: No  . Drug Use: No  . Sexual Activity:    Partners: Male     Comment: approx [redacted] wks gestation   Other Topics Concern  . Not on file   Social History Narrative    Outpatient Prescriptions Prior to Visit  Medication Sig Dispense Refill  . Prenatal Vit-Fe Fumarate-FA (PRENATAL MULTIVITAMIN) TABS tablet Take 1 tablet by mouth daily at 12 noon.    . Probiotic Product (PROBIOTIC DAILY PO) Take by mouth.    Marland Kitchen amoxicillin (AMOXIL) 875 MG tablet Take 1 tablet (875 mg total) by mouth 2 (two) times daily.  14 tablet 0  . fluconazole (DIFLUCAN) 150 MG tablet Take 1 tablet (150 mg total) by mouth once. 1 tablet 0   No facility-administered medications prior to visit.    No Known Allergies  Review of Systems  Constitutional: Negative for fever and malaise/fatigue.  HENT: Negative for congestion.   Eyes: Negative for discharge.  Respiratory: Negative for shortness of breath.   Cardiovascular: Negative for chest pain, palpitations and leg swelling.  Gastrointestinal: Positive for heartburn. Negative for nausea and abdominal pain.  Genitourinary: Negative for dysuria.  Musculoskeletal: Negative for falls.  Skin: Negative for rash.  Neurological: Negative for loss of consciousness and headaches.  Endo/Heme/Allergies: Negative for environmental allergies.  Psychiatric/Behavioral: Positive  for depression. The patient is nervous/anxious.        Objective:    Physical Exam  Constitutional: She is oriented to person, place, and time. She appears well-developed and well-nourished. No distress.  HENT:  Head: Normocephalic and atraumatic.  Nose: Nose normal.  Eyes: Right eye exhibits no discharge. Left eye exhibits no discharge.  Neck: Normal range of motion. Neck supple.  Cardiovascular: Normal rate and regular rhythm.   No murmur heard. Pulmonary/Chest: Effort normal and breath sounds normal.  Abdominal: Soft. Bowel sounds are normal. There is no tenderness.  Musculoskeletal: She exhibits no edema.  Neurological: She is alert and oriented to person, place, and time.  Skin: Skin is warm and dry.  Psychiatric: She has a normal mood and affect.  Nursing note and vitals reviewed.   BP 101/67 mmHg  Pulse 87  Temp(Src) 98.3 F (36.8 C) (Oral)  Ht 5\' 4"  (1.626 m)  Wt 145 lb 12.8 oz (66.134 kg)  BMI 25.01 kg/m2  SpO2 100% Wt Readings from Last 3 Encounters:  12/19/15 145 lb 12.8 oz (66.134 kg)  09/07/15 138 lb 12.8 oz (62.959 kg)  12/11/14 145 lb (65.772 kg)     Lab Results  Component Value Date   WBC 8.2 12/19/2015   HGB 13.7 12/19/2015   HCT 40.8 12/19/2015   PLT 307.0 12/19/2015   GLUCOSE 94 12/19/2015   CHOL 99 12/19/2015   TRIG 184.0* 12/19/2015   HDL 34.50* 12/19/2015   LDLCALC 28 12/19/2015   ALT 10 12/19/2015   AST 15 12/19/2015   NA 140 12/19/2015   K 3.9 12/19/2015   CL 104 12/19/2015   CREATININE 0.57 12/19/2015   BUN 9 12/19/2015   CO2 30 12/19/2015   TSH 1.02 12/19/2015    Lab Results  Component Value Date   TSH 1.02 12/19/2015   Lab Results  Component Value Date   WBC 8.2 12/19/2015   HGB 13.7 12/19/2015   HCT 40.8 12/19/2015   MCV 91.7 12/19/2015   PLT 307.0 12/19/2015   Lab Results  Component Value Date   NA 140 12/19/2015   K 3.9 12/19/2015   CO2 30 12/19/2015   GLUCOSE 94 12/19/2015   BUN 9 12/19/2015   CREATININE  0.57 12/19/2015   BILITOT 0.4 12/19/2015   ALKPHOS 52 12/19/2015   AST 15 12/19/2015   ALT 10 12/19/2015   PROT 6.6 12/19/2015   ALBUMIN 3.8 12/19/2015   CALCIUM 8.9 12/19/2015   GFR 125.37 12/19/2015   Lab Results  Component Value Date   CHOL 99 12/19/2015   Lab Results  Component Value Date   HDL 34.50* 12/19/2015   Lab Results  Component Value Date   LDLCALC 28 12/19/2015   Lab Results  Component Value Date   TRIG 184.0* 12/19/2015  Lab Results  Component Value Date   CHOLHDL 3 12/19/2015   No results found for: HGBA1C     Assessment & Plan:   Problem List Items Addressed This Visit    Acute upper respiratory infection    Encouraged increased rest and hydration, add probiotics, zinc such as Coldeze or Xicam. Treat fevers as needed. Mucinex bid      Depression with anxiety - Primary    Starts Lexapro daily and consider counseling as well. Patient will report any concerning side effects or will seek care if worsens.      Relevant Medications   escitalopram (LEXAPRO) 10 MG tablet   Dyspepsia    Avoid offending foods, start probiotics. Do not eat large meals in late evening and consider raising head of bed.       Relevant Orders   Amb ref to Medical Nutrition Therapy-MNT   TSH (Completed)   CBC (Completed)   Comprehensive metabolic panel (Completed)   Lipid panel (Completed)   Preventative health care    Patient encouraged to maintain heart healthy diet, regular exercise, adequate sleep. Consider daily probiotics. Take medications as prescribed. Given and reviewed copy of ACP documents from [employer] and encouraged to complete and return. Labs reviewed      Relevant Orders   TSH (Completed)   CBC (Completed)   Comprehensive metabolic panel (Completed)   Lipid panel (Completed)      I have discontinued Ms. Odell's amoxicillin and fluconazole. I am also having her start on escitalopram. Additionally, I am having her maintain her  prenatal multivitamin and Probiotic Product (PROBIOTIC DAILY PO).  Meds ordered this encounter  Medications  . escitalopram (LEXAPRO) 10 MG tablet    Sig: Take 1 tablet (10 mg total) by mouth daily.    Dispense:  30 tablet    Refill:  Harrison, MD

## 2015-12-19 NOTE — Patient Instructions (Signed)
Preventive Care for Adults, Female A healthy lifestyle and preventive care can promote health and wellness. Preventive health guidelines for women include the following key practices.  A routine yearly physical is a good way to check with your health care provider about your health and preventive screening. It is a chance to share any concerns and updates on your health and to receive a thorough exam.  Visit your dentist for a routine exam and preventive care every 6 months. Brush your teeth twice a day and floss once a day. Good oral hygiene prevents tooth decay and gum disease.  The frequency of eye exams is based on your age, health, family medical history, use of contact lenses, and other factors. Follow your health care provider's recommendations for frequency of eye exams.  Eat a healthy diet. Foods like vegetables, fruits, whole grains, low-fat dairy products, and lean protein foods contain the nutrients you need without too many calories. Decrease your intake of foods high in solid fats, added sugars, and salt. Eat the right amount of calories for you.Get information about a proper diet from your health care provider, if necessary.  Regular physical exercise is one of the most important things you can do for your health. Most adults should get at least 150 minutes of moderate-intensity exercise (any activity that increases your heart rate and causes you to sweat) each week. In addition, most adults need muscle-strengthening exercises on 2 or more days a week.  Maintain a healthy weight. The body mass index (BMI) is a screening tool to identify possible weight problems. It provides an estimate of body fat based on height and weight. Your health care provider can find your BMI and can help you achieve or maintain a healthy weight.For adults 20 years and older:  A BMI below 18.5 is considered underweight.  A BMI of 18.5 to 24.9 is normal.  A BMI of 25 to 29.9 is considered overweight.  A  BMI of 30 and above is considered obese.  Maintain normal blood lipids and cholesterol levels by exercising and minimizing your intake of saturated fat. Eat a balanced diet with plenty of fruit and vegetables. Blood tests for lipids and cholesterol should begin at age 45 and be repeated every 5 years. If your lipid or cholesterol levels are high, you are over 50, or you are at high risk for heart disease, you may need your cholesterol levels checked more frequently.Ongoing high lipid and cholesterol levels should be treated with medicines if diet and exercise are not working.  If you smoke, find out from your health care provider how to quit. If you do not use tobacco, do not start.  Lung cancer screening is recommended for adults aged 45-80 years who are at high risk for developing lung cancer because of a history of smoking. A yearly low-dose CT scan of the lungs is recommended for people who have at least a 30-pack-year history of smoking and are a current smoker or have quit within the past 15 years. A pack year of smoking is smoking an average of 1 pack of cigarettes a day for 1 year (for example: 1 pack a day for 30 years or 2 packs a day for 15 years). Yearly screening should continue until the smoker has stopped smoking for at least 15 years. Yearly screening should be stopped for people who develop a health problem that would prevent them from having lung cancer treatment.  If you are pregnant, do not drink alcohol. If you are  breastfeeding, be very cautious about drinking alcohol. If you are not pregnant and choose to drink alcohol, do not have more than 1 drink per day. One drink is considered to be 12 ounces (355 mL) of beer, 5 ounces (148 mL) of wine, or 1.5 ounces (44 mL) of liquor.  Avoid use of street drugs. Do not share needles with anyone. Ask for help if you need support or instructions about stopping the use of drugs.  High blood pressure causes heart disease and increases the risk  of stroke. Your blood pressure should be checked at least every 1 to 2 years. Ongoing high blood pressure should be treated with medicines if weight loss and exercise do not work.  If you are 55-79 years old, ask your health care provider if you should take aspirin to prevent strokes.  Diabetes screening is done by taking a blood sample to check your blood glucose level after you have not eaten for a certain period of time (fasting). If you are not overweight and you do not have risk factors for diabetes, you should be screened once every 3 years starting at age 45. If you are overweight or obese and you are 40-70 years of age, you should be screened for diabetes every year as part of your cardiovascular risk assessment.  Breast cancer screening is essential preventive care for women. You should practice "breast self-awareness." This means understanding the normal appearance and feel of your breasts and may include breast self-examination. Any changes detected, no matter how small, should be reported to a health care provider. Women in their 20s and 30s should have a clinical breast exam (CBE) by a health care provider as part of a regular health exam every 1 to 3 years. After age 40, women should have a CBE every year. Starting at age 40, women should consider having a mammogram (breast X-ray test) every year. Women who have a family history of breast cancer should talk to their health care provider about genetic screening. Women at a high risk of breast cancer should talk to their health care providers about having an MRI and a mammogram every year.  Breast cancer gene (BRCA)-related cancer risk assessment is recommended for women who have family members with BRCA-related cancers. BRCA-related cancers include breast, ovarian, tubal, and peritoneal cancers. Having family members with these cancers may be associated with an increased risk for harmful changes (mutations) in the breast cancer genes BRCA1 and  BRCA2. Results of the assessment will determine the need for genetic counseling and BRCA1 and BRCA2 testing.  Your health care provider may recommend that you be screened regularly for cancer of the pelvic organs (ovaries, uterus, and vagina). This screening involves a pelvic examination, including checking for microscopic changes to the surface of your cervix (Pap test). You may be encouraged to have this screening done every 3 years, beginning at age 21.  For women ages 30-65, health care providers may recommend pelvic exams and Pap testing every 3 years, or they may recommend the Pap and pelvic exam, combined with testing for human papilloma virus (HPV), every 5 years. Some types of HPV increase your risk of cervical cancer. Testing for HPV may also be done on women of any age with unclear Pap test results.  Other health care providers may not recommend any screening for nonpregnant women who are considered low risk for pelvic cancer and who do not have symptoms. Ask your health care provider if a screening pelvic exam is right for   you.  If you have had past treatment for cervical cancer or a condition that could lead to cancer, you need Pap tests and screening for cancer for at least 20 years after your treatment. If Pap tests have been discontinued, your risk factors (such as having a new sexual partner) need to be reassessed to determine if screening should resume. Some women have medical problems that increase the chance of getting cervical cancer. In these cases, your health care provider may recommend more frequent screening and Pap tests.  Colorectal cancer can be detected and often prevented. Most routine colorectal cancer screening begins at the age of 50 years and continues through age 75 years. However, your health care provider may recommend screening at an earlier age if you have risk factors for colon cancer. On a yearly basis, your health care provider may provide home test kits to check  for hidden blood in the stool. Use of a small camera at the end of a tube, to directly examine the colon (sigmoidoscopy or colonoscopy), can detect the earliest forms of colorectal cancer. Talk to your health care provider about this at age 50, when routine screening begins. Direct exam of the colon should be repeated every 5-10 years through age 75 years, unless early forms of precancerous polyps or small growths are found.  People who are at an increased risk for hepatitis B should be screened for this virus. You are considered at high risk for hepatitis B if:  You were born in a country where hepatitis B occurs often. Talk with your health care provider about which countries are considered high risk.  Your parents were born in a high-risk country and you have not received a shot to protect against hepatitis B (hepatitis B vaccine).  You have HIV or AIDS.  You use needles to inject street drugs.  You live with, or have sex with, someone who has hepatitis B.  You get hemodialysis treatment.  You take certain medicines for conditions like cancer, organ transplantation, and autoimmune conditions.  Hepatitis C blood testing is recommended for all people born from 1945 through 1965 and any individual with known risks for hepatitis C.  Practice safe sex. Use condoms and avoid high-risk sexual practices to reduce the spread of sexually transmitted infections (STIs). STIs include gonorrhea, chlamydia, syphilis, trichomonas, herpes, HPV, and human immunodeficiency virus (HIV). Herpes, HIV, and HPV are viral illnesses that have no cure. They can result in disability, cancer, and death.  You should be screened for sexually transmitted illnesses (STIs) including gonorrhea and chlamydia if:  You are sexually active and are younger than 24 years.  You are older than 24 years and your health care provider tells you that you are at risk for this type of infection.  Your sexual activity has changed  since you were last screened and you are at an increased risk for chlamydia or gonorrhea. Ask your health care provider if you are at risk.  If you are at risk of being infected with HIV, it is recommended that you take a prescription medicine daily to prevent HIV infection. This is called preexposure prophylaxis (PrEP). You are considered at risk if:  You are sexually active and do not regularly use condoms or know the HIV status of your partner(s).  You take drugs by injection.  You are sexually active with a partner who has HIV.  Talk with your health care provider about whether you are at high risk of being infected with HIV. If   you choose to begin PrEP, you should first be tested for HIV. You should then be tested every 3 months for as long as you are taking PrEP.  Osteoporosis is a disease in which the bones lose minerals and strength with aging. This can result in serious bone fractures or breaks. The risk of osteoporosis can be identified using a bone density scan. Women ages 67 years and over and women at risk for fractures or osteoporosis should discuss screening with their health care providers. Ask your health care provider whether you should take a calcium supplement or vitamin D to reduce the rate of osteoporosis.  Menopause can be associated with physical symptoms and risks. Hormone replacement therapy is available to decrease symptoms and risks. You should talk to your health care provider about whether hormone replacement therapy is right for you.  Use sunscreen. Apply sunscreen liberally and repeatedly throughout the day. You should seek shade when your shadow is shorter than you. Protect yourself by wearing long sleeves, pants, a wide-brimmed hat, and sunglasses year round, whenever you are outdoors.  Once a month, do a whole body skin exam, using a mirror to look at the skin on your back. Tell your health care provider of new moles, moles that have irregular borders, moles that  are larger than a pencil eraser, or moles that have changed in shape or color.  Stay current with required vaccines (immunizations).  Influenza vaccine. All adults should be immunized every year.  Tetanus, diphtheria, and acellular pertussis (Td, Tdap) vaccine. Pregnant women should receive 1 dose of Tdap vaccine during each pregnancy. The dose should be obtained regardless of the length of time since the last dose. Immunization is preferred during the 27th-36th week of gestation. An adult who has not previously received Tdap or who does not know her vaccine status should receive 1 dose of Tdap. This initial dose should be followed by tetanus and diphtheria toxoids (Td) booster doses every 10 years. Adults with an unknown or incomplete history of completing a 3-dose immunization series with Td-containing vaccines should begin or complete a primary immunization series including a Tdap dose. Adults should receive a Td booster every 10 years.  Varicella vaccine. An adult without evidence of immunity to varicella should receive 2 doses or a second dose if she has previously received 1 dose. Pregnant females who do not have evidence of immunity should receive the first dose after pregnancy. This first dose should be obtained before leaving the health care facility. The second dose should be obtained 4-8 weeks after the first dose.  Human papillomavirus (HPV) vaccine. Females aged 13-26 years who have not received the vaccine previously should obtain the 3-dose series. The vaccine is not recommended for use in pregnant females. However, pregnancy testing is not needed before receiving a dose. If a female is found to be pregnant after receiving a dose, no treatment is needed. In that case, the remaining doses should be delayed until after the pregnancy. Immunization is recommended for any person with an immunocompromised condition through the age of 61 years if she did not get any or all doses earlier. During the  3-dose series, the second dose should be obtained 4-8 weeks after the first dose. The third dose should be obtained 24 weeks after the first dose and 16 weeks after the second dose.  Zoster vaccine. One dose is recommended for adults aged 30 years or older unless certain conditions are present.  Measles, mumps, and rubella (MMR) vaccine. Adults born  before 1957 generally are considered immune to measles and mumps. Adults born in 1957 or later should have 1 or more doses of MMR vaccine unless there is a contraindication to the vaccine or there is laboratory evidence of immunity to each of the three diseases. A routine second dose of MMR vaccine should be obtained at least 28 days after the first dose for students attending postsecondary schools, health care workers, or international travelers. People who received inactivated measles vaccine or an unknown type of measles vaccine during 1963-1967 should receive 2 doses of MMR vaccine. People who received inactivated mumps vaccine or an unknown type of mumps vaccine before 1979 and are at high risk for mumps infection should consider immunization with 2 doses of MMR vaccine. For females of childbearing age, rubella immunity should be determined. If there is no evidence of immunity, females who are not pregnant should be vaccinated. If there is no evidence of immunity, females who are pregnant should delay immunization until after pregnancy. Unvaccinated health care workers born before 1957 who lack laboratory evidence of measles, mumps, or rubella immunity or laboratory confirmation of disease should consider measles and mumps immunization with 2 doses of MMR vaccine or rubella immunization with 1 dose of MMR vaccine.  Pneumococcal 13-valent conjugate (PCV13) vaccine. When indicated, a person who is uncertain of his immunization history and has no record of immunization should receive the PCV13 vaccine. All adults 65 years of age and older should receive this  vaccine. An adult aged 19 years or older who has certain medical conditions and has not been previously immunized should receive 1 dose of PCV13 vaccine. This PCV13 should be followed with a dose of pneumococcal polysaccharide (PPSV23) vaccine. Adults who are at high risk for pneumococcal disease should obtain the PPSV23 vaccine at least 8 weeks after the dose of PCV13 vaccine. Adults older than 40 years of age who have normal immune system function should obtain the PPSV23 vaccine dose at least 1 year after the dose of PCV13 vaccine.  Pneumococcal polysaccharide (PPSV23) vaccine. When PCV13 is also indicated, PCV13 should be obtained first. All adults aged 65 years and older should be immunized. An adult younger than age 65 years who has certain medical conditions should be immunized. Any person who resides in a nursing home or long-term care facility should be immunized. An adult smoker should be immunized. People with an immunocompromised condition and certain other conditions should receive both PCV13 and PPSV23 vaccines. People with human immunodeficiency virus (HIV) infection should be immunized as soon as possible after diagnosis. Immunization during chemotherapy or radiation therapy should be avoided. Routine use of PPSV23 vaccine is not recommended for American Indians, [place] Natives, or people younger than 65 years unless there are medical conditions that require PPSV23 vaccine. When indicated, people who have unknown immunization and have no record of immunization should receive PPSV23 vaccine. One-time revaccination 5 years after the first dose of PPSV23 is recommended for people aged 19-64 years who have chronic kidney failure, nephrotic syndrome, asplenia, or immunocompromised conditions. People who received 1-2 doses of PPSV23 before age 65 years should receive another dose of PPSV23 vaccine at age 65 years or later if at least 5 years have passed since the previous dose. Doses of PPSV23 are not  needed for people immunized with PPSV23 at or after age 65 years.  Meningococcal vaccine. Adults with asplenia or persistent complement component deficiencies should receive 2 doses of quadrivalent meningococcal conjugate (MenACWY-D) vaccine. The doses should be obtained   at least 2 months apart. Microbiologists working with certain meningococcal bacteria, [place] recruits, people at risk during an outbreak, and people who travel to or live in countries with a high rate of meningitis should be immunized. A first-year college student up through age 34 years who is living in a residence hall should receive a dose if she did not receive a dose on or after her 16th birthday. Adults who have certain high-risk conditions should receive one or more doses of vaccine.  Hepatitis A vaccine. Adults who wish to be protected from this disease, have certain high-risk conditions, work with hepatitis A-infected animals, work in hepatitis A research labs, or travel to or work in countries with a high rate of hepatitis A should be immunized. Adults who were previously unvaccinated and who anticipate close contact with an international adoptee during the first 60 days after arrival in the [place] from a country with a high rate of hepatitis A should be immunized.  Hepatitis B vaccine. Adults who wish to be protected from this disease, have certain high-risk conditions, may be exposed to blood or other infectious body fluids, are household contacts or sex partners of hepatitis B positive people, are clients or workers in certain care facilities, or travel to or work in countries with a high rate of hepatitis B should be immunized.  Haemophilus influenzae type b (Hib) vaccine. A previously unvaccinated person with asplenia or sickle cell disease or having a scheduled splenectomy should receive 1 dose of Hib vaccine. Regardless of previous immunization, a recipient of a hematopoietic stem cell transplant should receive a  3-dose series 6-12 months after her successful transplant. Hib vaccine is not recommended for adults with HIV infection. Preventive Services / Frequency Ages 35 to 4 years  Blood pressure check.** / Every 3-5 years.  Lipid and cholesterol check.** / Every 5 years beginning at age 60.  Clinical breast exam.** / Every 3 years for women in their 71s and 10s.  BRCA-related cancer risk assessment.** / For women who have family members with a BRCA-related cancer (breast, ovarian, tubal, or peritoneal cancers).  Pap test.** / Every 2 years from ages 76 through 26. Every 3 years starting at age 61 through age 76 or 93 with a history of 3 consecutive normal Pap tests.  HPV screening.** / Every 3 years from ages 37 through ages 60 to 51 with a history of 3 consecutive normal Pap tests.  Hepatitis C blood test.** / For any individual with known risks for hepatitis C.  Skin self-exam. / Monthly.  Influenza vaccine. / Every year.  Tetanus, diphtheria, and acellular pertussis (Tdap, Td) vaccine.** / Consult your health care provider. Pregnant women should receive 1 dose of Tdap vaccine during each pregnancy. 1 dose of Td every 10 years.  Varicella vaccine.** / Consult your health care provider. Pregnant females who do not have evidence of immunity should receive the first dose after pregnancy.  HPV vaccine. / 3 doses over 6 months, if 93 and younger. The vaccine is not recommended for use in pregnant females. However, pregnancy testing is not needed before receiving a dose.  Measles, mumps, rubella (MMR) vaccine.** / You need at least 1 dose of MMR if you were born in 1957 or later. You may also need a 2nd dose. For females of childbearing age, rubella immunity should be determined. If there is no evidence of immunity, females who are not pregnant should be vaccinated. If there is no evidence of immunity, females who are  pregnant should delay immunization until after pregnancy.  Pneumococcal  13-valent conjugate (PCV13) vaccine.** / Consult your health care provider.  Pneumococcal polysaccharide (PPSV23) vaccine.** / 1 to 2 doses if you smoke cigarettes or if you have certain conditions.  Meningococcal vaccine.** / 1 dose if you are age 68 to 8 years and a Market researcher living in a residence hall, or have one of several medical conditions, you need to get vaccinated against meningococcal disease. You may also need additional booster doses.  Hepatitis A vaccine.** / Consult your health care provider.  Hepatitis B vaccine.** / Consult your health care provider.  Haemophilus influenzae type b (Hib) vaccine.** / Consult your health care provider. Ages 7 to 53 years  Blood pressure check.** / Every year.  Lipid and cholesterol check.** / Every 5 years beginning at age 25 years.  Lung cancer screening. / Every year if you are aged 11-80 years and have a 30-pack-year history of smoking and currently smoke or have quit within the past 15 years. Yearly screening is stopped once you have quit smoking for at least 15 years or develop a health problem that would prevent you from having lung cancer treatment.  Clinical breast exam.** / Every year after age 48 years.  BRCA-related cancer risk assessment.** / For women who have family members with a BRCA-related cancer (breast, ovarian, tubal, or peritoneal cancers).  Mammogram.** / Every year beginning at age 41 years and continuing for as long as you are in good health. Consult with your health care provider.  Pap test.** / Every 3 years starting at age 65 years through age 37 or 70 years with a history of 3 consecutive normal Pap tests.  HPV screening.** / Every 3 years from ages 72 years through ages 60 to 40 years with a history of 3 consecutive normal Pap tests.  Fecal occult blood test (FOBT) of stool. / Every year beginning at age 21 years and continuing until age 5 years. You may not need to do this test if you get  a colonoscopy every 10 years.  Flexible sigmoidoscopy or colonoscopy.** / Every 5 years for a flexible sigmoidoscopy or every 10 years for a colonoscopy beginning at age 35 years and continuing until age 48 years.  Hepatitis C blood test.** / For all people born from [birth year] through 1965 and any individual with known risks for hepatitis C.  Skin self-exam. / Monthly.  Influenza vaccine. / Every year.  Tetanus, diphtheria, and acellular pertussis (Tdap/Td) vaccine.** / Consult your health care provider. Pregnant women should receive 1 dose of Tdap vaccine during each pregnancy. 1 dose of Td every 10 years.  Varicella vaccine.** / Consult your health care provider. Pregnant females who do not have evidence of immunity should receive the first dose after pregnancy.  Zoster vaccine.** / 1 dose for adults aged 30 years or older.  Measles, mumps, rubella (MMR) vaccine.** / You need at least 1 dose of MMR if you were born in 1957 or later. You may also need a second dose. For females of childbearing age, rubella immunity should be determined. If there is no evidence of immunity, females who are not pregnant should be vaccinated. If there is no evidence of immunity, females who are pregnant should delay immunization until after pregnancy.  Pneumococcal 13-valent conjugate (PCV13) vaccine.** / Consult your health care provider.  Pneumococcal polysaccharide (PPSV23) vaccine.** / 1 to 2 doses if you smoke cigarettes or if you have certain conditions.  Meningococcal vaccine.** /  Consult your health care provider.  Hepatitis A vaccine.** / Consult your health care provider.  Hepatitis B vaccine.** / Consult your health care provider.  Haemophilus influenzae type b (Hib) vaccine.** / Consult your health care provider. Ages 64 years and over  Blood pressure check.** / Every year.  Lipid and cholesterol check.** / Every 5 years beginning at age 23 years.  Lung cancer screening. / Every year if you  are aged 16-80 years and have a 30-pack-year history of smoking and currently smoke or have quit within the past 15 years. Yearly screening is stopped once you have quit smoking for at least 15 years or develop a health problem that would prevent you from having lung cancer treatment.  Clinical breast exam.** / Every year after age 74 years.  BRCA-related cancer risk assessment.** / For women who have family members with a BRCA-related cancer (breast, ovarian, tubal, or peritoneal cancers).  Mammogram.** / Every year beginning at age 44 years and continuing for as long as you are in good health. Consult with your health care provider.  Pap test.** / Every 3 years starting at age 58 years through age 22 or 39 years with 3 consecutive normal Pap tests. Testing can be stopped between 65 and 70 years with 3 consecutive normal Pap tests and no abnormal Pap or HPV tests in the past 10 years.  HPV screening.** / Every 3 years from ages 64 years through ages 70 or 61 years with a history of 3 consecutive normal Pap tests. Testing can be stopped between 65 and 70 years with 3 consecutive normal Pap tests and no abnormal Pap or HPV tests in the past 10 years.  Fecal occult blood test (FOBT) of stool. / Every year beginning at age 40 years and continuing until age 27 years. You may not need to do this test if you get a colonoscopy every 10 years.  Flexible sigmoidoscopy or colonoscopy.** / Every 5 years for a flexible sigmoidoscopy or every 10 years for a colonoscopy beginning at age 7 years and continuing until age 32 years.  Hepatitis C blood test.** / For all people born from [birth year] through 1965 and any individual with known risks for hepatitis C.  Osteoporosis screening.** / A one-time screening for women ages 30 years and over and women at risk for fractures or osteoporosis.  Skin self-exam. / Monthly.  Influenza vaccine. / Every year.  Tetanus, diphtheria, and acellular pertussis (Tdap/Td)  vaccine.** / 1 dose of Td every 10 years.  Varicella vaccine.** / Consult your health care provider.  Zoster vaccine.** / 1 dose for adults aged 35 years or older.  Pneumococcal 13-valent conjugate (PCV13) vaccine.** / Consult your health care provider.  Pneumococcal polysaccharide (PPSV23) vaccine.** / 1 dose for all adults aged 46 years and older.  Meningococcal vaccine.** / Consult your health care provider.  Hepatitis A vaccine.** / Consult your health care provider.  Hepatitis B vaccine.** / Consult your health care provider.  Haemophilus influenzae type b (Hib) vaccine.** / Consult your health care provider. ** Family history and personal history of risk and conditions may change your health care provider's recommendations.   This information is not intended to replace advice given to you by your health care provider. Make sure you discuss any questions you have with your health care provider.   Document Released: 12/30/2001 Document Revised: 11/24/2014 Document Reviewed: 03/31/2011 Elsevier Interactive Patient Education [employer].

## 2015-12-23 ENCOUNTER — Encounter: Payer: Self-pay | Admitting: Family Medicine

## 2015-12-23 DIAGNOSIS — F418 Other specified anxiety disorders: Secondary | ICD-10-CM

## 2015-12-23 DIAGNOSIS — R1013 Epigastric pain: Secondary | ICD-10-CM | POA: Insufficient documentation

## 2015-12-23 HISTORY — DX: Other specified anxiety disorders: F41.8

## 2015-12-23 NOTE — Assessment & Plan Note (Signed)
Avoid offending foods, start probiotics. Do not eat large meals in late evening and consider raising head of bed.  

## 2015-12-23 NOTE — Assessment & Plan Note (Signed)
Encouraged increased rest and hydration, add probiotics, zinc such as Coldeze or Xicam. Treat fevers as needed. Mucinex bid

## 2015-12-23 NOTE — Assessment & Plan Note (Signed)
Starts Lexapro daily and consider counseling as well. Patient will report any concerning side effects or will seek care if worsens.

## 2015-12-23 NOTE — Assessment & Plan Note (Signed)
Patient encouraged to maintain heart healthy diet, regular exercise, adequate sleep. Consider daily probiotics. Take medications as prescribed. Given and reviewed copy of ACP documents from [place] Secretary of State and encouraged to complete and return. Labs reviewed.  

## 2016-01-09 ENCOUNTER — Ambulatory Visit (INDEPENDENT_AMBULATORY_CARE_PROVIDER_SITE_OTHER): Payer: 59 | Admitting: Psychology

## 2016-01-09 DIAGNOSIS — F4323 Adjustment disorder with mixed anxiety and depressed mood: Secondary | ICD-10-CM | POA: Diagnosis not present

## 2016-01-23 ENCOUNTER — Ambulatory Visit (INDEPENDENT_AMBULATORY_CARE_PROVIDER_SITE_OTHER): Payer: 59 | Admitting: Psychology

## 2016-01-23 DIAGNOSIS — F4323 Adjustment disorder with mixed anxiety and depressed mood: Secondary | ICD-10-CM | POA: Diagnosis not present

## 2016-01-23 MED FILL — ESCITALOPRAM 10 MG TABLET: 10 | 30 days supply | Qty: 30 | Fill #1

## 2016-01-28 DIAGNOSIS — Z6824 Body mass index (BMI) 24.0-24.9, adult: Secondary | ICD-10-CM | POA: Diagnosis not present

## 2016-01-28 DIAGNOSIS — Z01419 Encounter for gynecological examination (general) (routine) without abnormal findings: Secondary | ICD-10-CM | POA: Diagnosis not present

## 2016-01-28 DIAGNOSIS — Z1151 Encounter for screening for human papillomavirus (HPV): Secondary | ICD-10-CM | POA: Diagnosis not present

## 2016-02-04 DIAGNOSIS — D1801 Hemangioma of skin and subcutaneous tissue: Secondary | ICD-10-CM | POA: Diagnosis not present

## 2016-02-04 DIAGNOSIS — D225 Melanocytic nevi of trunk: Secondary | ICD-10-CM | POA: Diagnosis not present

## 2016-02-04 DIAGNOSIS — L821 Other seborrheic keratosis: Secondary | ICD-10-CM | POA: Diagnosis not present

## 2016-02-04 DIAGNOSIS — L814 Other melanin hyperpigmentation: Secondary | ICD-10-CM | POA: Diagnosis not present

## 2016-02-04 DIAGNOSIS — L708 Other acne: Secondary | ICD-10-CM | POA: Diagnosis not present

## 2016-02-04 DIAGNOSIS — D220 Melanocytic nevi of lip: Secondary | ICD-10-CM | POA: Diagnosis not present

## 2016-02-06 ENCOUNTER — Ambulatory Visit (INDEPENDENT_AMBULATORY_CARE_PROVIDER_SITE_OTHER): Payer: 59 | Admitting: Psychology

## 2016-02-06 DIAGNOSIS — F4323 Adjustment disorder with mixed anxiety and depressed mood: Secondary | ICD-10-CM | POA: Diagnosis not present

## 2016-02-07 ENCOUNTER — Encounter: Payer: 59 | Attending: Family Medicine | Admitting: Dietician

## 2016-02-07 ENCOUNTER — Encounter: Payer: Self-pay | Admitting: Dietician

## 2016-02-07 VITALS — Ht 64.0 in | Wt 139.0 lb

## 2016-02-07 DIAGNOSIS — R1013 Epigastric pain: Secondary | ICD-10-CM | POA: Diagnosis not present

## 2016-02-07 DIAGNOSIS — F32A Depression, unspecified: Secondary | ICD-10-CM

## 2016-02-07 DIAGNOSIS — F329 Major depressive disorder, single episode, unspecified: Secondary | ICD-10-CM | POA: Diagnosis not present

## 2016-02-07 DIAGNOSIS — K589 Irritable bowel syndrome without diarrhea: Secondary | ICD-10-CM

## 2016-02-07 NOTE — Progress Notes (Signed)
Medical Nutrition Therapy:  Appt start time: 0950 end time:  1210.   Assessment:  Primary concerns today: Patient is here alone.  She has some health goals for herself and her children and wants to make it easier to make healthy choices.  "My kids eat crap because I feed them crap".  "I do not make enough time for meal planning and grocery shopping."  Often poptarts for breakfast.  Feels in a rut.  She would like to lose weight 130-135 lbs. Referral for depression and dyspepsia although, patient reports more issues with constipation and diarrhea.  She has tried psyllium and metamucil and tolerated the metamucil best.  Low weight:  128 in college Current weight 139 lbs High:  145 lbs and this is fairly normal for her.   Lives with 4 children ages 14, 72, 63, and 54 months and husband.  Patient shops and cooks mostly.  Husband helps at times.  Husband works 3rd shift. 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  They try to have family meals.  Oldest son with texture aversion for pureed foods such as mashed potatoes and pudding.  She is currently less hungry on Lexapro.  Preferred Learning Style:   No preference indicated   Learning Readiness:   Ready  MEDICATIONS: see list   DIETARY INTAKE:  24-hr recall:  B ( AM): Cinnamon Toast Crunch and 2 donut holes with milk  Snk ( AM):   L ( PM): 1/2 sub Snk ( PM): popcorn D ( PM): 1/2 sub Snk ( PM): none Beverages: water, coffee with creamer, diet or regular soda  Usual physical activity: "chasing kids", walks 1-2 times per week, has gym membership but doesn't go.  Estimated energy needs: 1800 calories 60-70 g protein   Progress Towards Goal(s):  In progress.   Nutritional Diagnosis:  NB-1.1 Food and nutrition-related knowledge deficit As related to healthy eating for family and meal planning.  As evidenced by patient report and diet hx..    Intervention:  Nutrition counseling/education on healthy  eating and meal planning for the family.  Discussed studies showing benefit of Omega-3 on depression as well as importance of overall diet Discussed mindful eating, discouraged dieting and spoke of appropriate weight goals. Discussed fiber and needs to increase slowly and avoid when diarrhea.  Breakfast ideas:  Yogurt and fruit  Toast with Peanut butter and fruit and milk  Cereal and milk (Chex, rice krispies, Kix read labels) and fruit  Trail mix (chex, nuts, dried fruit) and milk  Frozen waffles (Nutrigrain) with fruit and milk  Boiled egg, Toast or oatmeal, fruit  Nutrigrain bar or Natures bakery fig bar, milk, fruit Increase your vegetable intake:  Have a stocked pantry, get ready cut as desired, frozen vegetables, canned beans.  Meal ideas: Bean burritos with vegetables  Try adding rinsed beans to past salad.   Adding other veges to your pasta sauce. Try dip or humus for raw veges. Plan 2 or 3 meals per week. Cook extra and put part in the freezer. Make a big pot of soup once a week. Ravioli served over sauce and cooked spinach. Stirfry [employer] a notebook.  Omega 3:  Salmon, tuna, walnuts, chia seeds, ground flax seeds  Web site references:  Loss adjuster, chartered.org and [employer]  Teaching Method Utilized:  Visual Auditory   Handouts given during visit include:  2 week sample meal plan from choose my [email or web address]  Barriers to learning/adherence to lifestyle  change: organization/time  Demonstrated degree of understanding via:  Teach Back   Monitoring/Evaluation:  Dietary intake, exercise, and body weight prn.

## 2016-02-07 NOTE — Patient Instructions (Signed)
Breakfast ideas:  Yogurt and fruit  Toast with Peanut butter and fruit and milk  Cereal and milk (Chex, rice krispies, Kix read labels) and fruit  Trail mix (chex, nuts, dried fruit) and milk  Frozen waffles (Nutrigrain) with fruit and milk  Boiled egg, Toast or oatmeal, fruit  Nutrigrain bar or Natures bakery fig bar, milk, fruit Increase your vegetable intake:  Have a stocked pantry, get ready cut as desired, frozen vegetables, canned beans.  Meal ideas: Bean burritos with vegetables  Try adding rinsed beans to past salad.   Adding other veges to your pasta sauce. Try dip or humus for raw veges. Plan 2 or 3 meals per week. Cook extra and put part in the freezer. Make a big pot of soup once a week. Ravioli served over sauce and cooked spinach. Stirfry [employer] a notebook.  Omega 3:  Salmon, tuna, walnuts, chia seeds, ground flax seeds

## 2016-02-20 ENCOUNTER — Ambulatory Visit (INDEPENDENT_AMBULATORY_CARE_PROVIDER_SITE_OTHER): Payer: 59 | Admitting: Psychology

## 2016-02-20 DIAGNOSIS — F4323 Adjustment disorder with mixed anxiety and depressed mood: Secondary | ICD-10-CM | POA: Diagnosis not present

## 2016-02-29 MED FILL — ESCITALOPRAM 10 MG TABLET: 10 | 30 days supply | Qty: 30 | Fill #2

## 2016-03-05 ENCOUNTER — Ambulatory Visit: Payer: 59 | Admitting: Psychology

## 2016-03-25 ENCOUNTER — Ambulatory Visit: Payer: 59 | Admitting: Family Medicine

## 2016-04-03 ENCOUNTER — Encounter: Payer: Self-pay | Admitting: Family Medicine

## 2016-04-03 ENCOUNTER — Ambulatory Visit (INDEPENDENT_AMBULATORY_CARE_PROVIDER_SITE_OTHER): Payer: 59 | Admitting: Family Medicine

## 2016-04-03 VITALS — BP 102/62 | HR 77 | Temp 98.2°F | Ht 64.0 in | Wt 141.2 lb

## 2016-04-03 DIAGNOSIS — F32A Depression, unspecified: Secondary | ICD-10-CM

## 2016-04-03 DIAGNOSIS — F329 Major depressive disorder, single episode, unspecified: Secondary | ICD-10-CM | POA: Diagnosis not present

## 2016-04-03 MED ORDER — ESCITALOPRAM OXALATE 10 MG PO TABS: 10.0000 mg | ORAL_TABLET | Freq: Two times a day (BID) | ORAL | Status: DC

## 2016-04-03 MED FILL — ESCITALOPRAM 10 MG TABLET: 10 | 90 days supply | Qty: 180 | Fill #0

## 2016-04-03 NOTE — Progress Notes (Signed)
Patient ID: Ruth Cole, female   DOB: [birth year], 40 y.o.   MRN: [id number]   Subjective:    Patient ID: Ruth Cole, female    DOB: Jun 11, 1976, 40 y.o.   MRN: [id number]  Chief Complaint  Patient presents with  . Follow-up  h  HPI Patient is in today for follow up. Is in for follow up, she believes that she is better with initiation of Lexapro. Her anhedonia and anxiety are better but she is still struggling. She has low mood at times and is considering increasing her Lexapro. She had several considerable stressors during past few months.and did well. No concerning sed effects. Denies CP/palp/SOB/HA/congestion/fevers/GI or GU c/o. Taking meds as prescribed  Past Medical History  Diagnosis Date  . Chicken pox as a child  . Vaginal delivery     X 3  . H/O sinusitis     improved s/p surgical repair of deviated septum  . Urinary incontinence     s/p 3rd vaginal delivery, occasional  . Preventative health care 02/02/2012  . Insomnia 02/02/2012  . IBS (irritable bowel syndrome) 02/02/2012  . Allergy     seasonal  . PONV (postoperative nausea and vomiting)   . Onychomycosis 10/02/2013  . Miscarriage within last 12 months 12/12/2013  . Postpartum care following vaginal delivery (11/15) 10/01/2014  . Acute upper respiratory infection 11/22/2013  . History of chicken pox   . Depression with anxiety 12/23/2015    Past Surgical History  Procedure Laterality Date  . Mandible fracture surgery  2008  . Deviated septum repair  teenager  . Cleft palate repair  as a child  . Skin biopsy      multiple all benign  . Dilation and evacuation N/A 02/24/2013    Procedure: DILATATION AND EVACUATION;  Surgeon: Princess Bruins, MD;  Location: [facility];  Service: Gynecology;  Laterality: N/A;  . Dilation and evacuation N/A 08/24/2013    Procedure: DILATATION AND EVACUATION;  Surgeon: Princess Bruins, MD;  Location: [facility];  Service: Gynecology;  Laterality: N/A;    Family History  Problem  Relation Age of Onset  . Hypertension Mother   . Hyperlipidemia Mother   . Diabetes Father     type 2  . Kidney disease Father   . Alcohol abuse Father   . Stroke Maternal Grandmother     few  . Cancer Maternal Grandfather     stomach  . Heart attack Maternal Grandfather     few  . Cancer Paternal Grandfather     lung- nonsmoker    Social History   Social History  . Marital Status: Married    Spouse Name: N/A  . Number of Children: N/A  . Years of Education: N/A   Occupational History  . Not on file.   Social History Main Topics  . Smoking status: Never Smoker   . Smokeless tobacco: Never Used  . Alcohol Use: No  . Drug Use: No  . Sexual Activity:    Partners: Male     Comment: approx [redacted] wks gestation   Other Topics Concern  . Not on file   Social History Narrative    Outpatient Prescriptions Prior to Visit  Medication Sig Dispense Refill  . escitalopram (LEXAPRO) 10 MG tablet Take 1 tablet (10 mg total) by mouth daily. 30 tablet 3  . Prenatal Vit-Fe Fumarate-FA (PRENATAL MULTIVITAMIN) TABS tablet Take 1 tablet by mouth daily at 12 noon.    . Probiotic Product (PROBIOTIC DAILY PO) Take  by mouth.     No facility-administered medications prior to visit.    No Known Allergies  Review of Systems  Constitutional: Negative for fever.  HENT: Negative for congestion and sore throat.   Respiratory: Negative for cough, sputum production and shortness of breath.   Cardiovascular: Negative for chest pain, palpitations and leg swelling.  Gastrointestinal: Negative for heartburn.  Musculoskeletal: Negative for myalgias.  Neurological: Negative for dizziness, speech change and headaches.  Psychiatric/Behavioral: Positive for depression.       Objective:    Physical Exam  Constitutional: She is oriented to person, place, and time. She appears well-developed and well-nourished. No distress.  HENT:  Head: Normocephalic and atraumatic.  Nose: Nose normal.  Eyes:  Right eye exhibits no discharge. Left eye exhibits no discharge.  Neck: Normal range of motion. Neck supple.  Cardiovascular: Normal rate and regular rhythm.   No murmur heard. Pulmonary/Chest: Effort normal and breath sounds normal.  Abdominal: Soft. Bowel sounds are normal. There is no tenderness.  Musculoskeletal: She exhibits no edema.  Neurological: She is alert and oriented to person, place, and time.  Skin: Skin is warm and dry.  Psychiatric: She has a normal mood and affect.  Nursing note and vitals reviewed.   BP 102/62 mmHg  Pulse 77  Temp(Src) 98.2 F (36.8 C) (Oral)  Ht 5\' 4"  (1.626 m)  Wt 141 lb 4 oz (64.071 kg)  BMI 24.23 kg/m2  SpO2 97% Wt Readings from Last 3 Encounters:  04/03/16 141 lb 4 oz (64.071 kg)  02/07/16 139 lb (63.05 kg)  12/19/15 145 lb 12.8 oz (66.134 kg)     Lab Results  Component Value Date   WBC 8.2 12/19/2015   HGB 13.7 12/19/2015   HCT 40.8 12/19/2015   PLT 307.0 12/19/2015   GLUCOSE 94 12/19/2015   CHOL 99 12/19/2015   TRIG 184.0* 12/19/2015   HDL 34.50* 12/19/2015   LDLCALC 28 12/19/2015   ALT 10 12/19/2015   AST 15 12/19/2015   NA 140 12/19/2015   K 3.9 12/19/2015   CL 104 12/19/2015   CREATININE 0.57 12/19/2015   BUN 9 12/19/2015   CO2 30 12/19/2015   TSH 1.02 12/19/2015    Lab Results  Component Value Date   TSH 1.02 12/19/2015   Lab Results  Component Value Date   WBC 8.2 12/19/2015   HGB 13.7 12/19/2015   HCT 40.8 12/19/2015   MCV 91.7 12/19/2015   PLT 307.0 12/19/2015   Lab Results  Component Value Date   NA 140 12/19/2015   K 3.9 12/19/2015   CO2 30 12/19/2015   GLUCOSE 94 12/19/2015   BUN 9 12/19/2015   CREATININE 0.57 12/19/2015   BILITOT 0.4 12/19/2015   ALKPHOS 52 12/19/2015   AST 15 12/19/2015   ALT 10 12/19/2015   PROT 6.6 12/19/2015   ALBUMIN 3.8 12/19/2015   CALCIUM 8.9 12/19/2015   GFR 125.37 12/19/2015   Lab Results  Component Value Date   CHOL 99 12/19/2015   Lab Results    Component Value Date   HDL 34.50* 12/19/2015   Lab Results  Component Value Date   LDLCALC 28 12/19/2015   Lab Results  Component Value Date   TRIG 184.0* 12/19/2015   Lab Results  Component Value Date   CHOLHDL 3 12/19/2015   No results found for: HGBA1C     Assessment & Plan:   Problem List Items Addressed This Visit    None    Visit Diagnoses  Depression    -  Primary       I am having Ms. Hench maintain her prenatal multivitamin, Probiotic Product (PROBIOTIC DAILY PO), and escitalopram.  No orders of the defined types were placed in this encounter.     [name], MD

## 2016-04-03 NOTE — Patient Instructions (Signed)
Generalized Anxiety Disorder Generalized anxiety disorder (GAD) is a mental disorder. It interferes with life functions, including relationships, work, and school. GAD is different from normal anxiety, which everyone experiences at some point in their lives in response to specific life events and activities. Normal anxiety actually helps us prepare for and get through these life events and activities. Normal anxiety goes away after the event or activity is over.  GAD causes anxiety that is not necessarily related to specific events or activities. It also causes excess anxiety in proportion to specific events or activities. The anxiety associated with GAD is also difficult to control. GAD can vary from mild to severe. People with severe GAD can have intense waves of anxiety with physical symptoms (panic attacks).  SYMPTOMS The anxiety and worry associated with GAD are difficult to control. This anxiety and worry are related to many life events and activities and also occur more days than not for 6 months or longer. People with GAD also have three or more of the following symptoms (one or more in children):  Restlessness.   Fatigue.  Difficulty concentrating.   Irritability.  Muscle tension.  Difficulty sleeping or unsatisfying sleep. DIAGNOSIS GAD is diagnosed through an assessment by your health care provider. Your health care provider will ask you questions aboutyour mood,physical symptoms, and events in your life. Your health care provider may ask you about your medical history and use of alcohol or drugs, including prescription medicines. Your health care provider may also do a physical exam and blood tests. Certain medical conditions and the use of certain substances can cause symptoms similar to those associated with GAD. Your health care provider may refer you to a mental health specialist for further evaluation. TREATMENT The following therapies are usually used to treat GAD:    Medication. Antidepressant medication usually is prescribed for long-term daily control. Antianxiety medicines may be added in severe cases, especially when panic attacks occur.   Talk therapy (psychotherapy). Certain types of talk therapy can be helpful in treating GAD by providing support, education, and guidance. A form of talk therapy called cognitive behavioral therapy can teach you healthy ways to think about and react to daily life events and activities.  Stress managementtechniques. These include yoga, meditation, and exercise and can be very helpful when they are practiced regularly. A mental health specialist can help determine which treatment is best for you. Some people see improvement with one therapy. However, other people require a combination of therapies.   This information is not intended to replace advice given to you by your health care provider. Make sure you discuss any questions you have with your health care provider.   Document Released: 02/28/2013 Document Revised: 11/24/2014 Document Reviewed: 02/28/2013 Elsevier Interactive Patient Education 2016 Elsevier Inc.  

## 2016-04-03 NOTE — Progress Notes (Signed)
Pre visit review using our clinic review tool, if applicable. No additional management support is needed unless otherwise documented below in the visit note. 

## 2016-04-04 DIAGNOSIS — H5213 Myopia, bilateral: Secondary | ICD-10-CM | POA: Diagnosis not present

## 2016-08-04 ENCOUNTER — Ambulatory Visit (INDEPENDENT_AMBULATORY_CARE_PROVIDER_SITE_OTHER): Payer: 59 | Admitting: Family Medicine

## 2016-08-04 ENCOUNTER — Encounter: Payer: Self-pay | Admitting: Family Medicine

## 2016-08-04 DIAGNOSIS — R1013 Epigastric pain: Secondary | ICD-10-CM | POA: Diagnosis not present

## 2016-08-04 DIAGNOSIS — F329 Major depressive disorder, single episode, unspecified: Secondary | ICD-10-CM

## 2016-08-04 DIAGNOSIS — F32A Depression, unspecified: Secondary | ICD-10-CM

## 2016-08-04 DIAGNOSIS — J069 Acute upper respiratory infection, unspecified: Secondary | ICD-10-CM | POA: Diagnosis not present

## 2016-08-04 DIAGNOSIS — F418 Other specified anxiety disorders: Secondary | ICD-10-CM | POA: Diagnosis not present

## 2016-08-04 MED ORDER — ESCITALOPRAM OXALATE 10 MG PO TABS
10.0000 mg | ORAL_TABLET | Freq: Every day | ORAL | 1 refills | Status: DC
Start: 2016-08-04 — End: 2016-11-05

## 2016-08-04 MED ORDER — AMOXICILLIN-POT CLAVULANATE 875-125 MG PO TABS: 1.0000 | ORAL_TABLET | Freq: Two times a day (BID) | ORAL | 0 refills | Status: DC

## 2016-08-04 NOTE — Patient Instructions (Addendum)
Major Depressive Disorder Major depressive disorder is a mental illness. It also may be called clinical depression or unipolar depression. Major depressive disorder usually causes feelings of sadness, hopelessness, or helplessness. Some people with this disorder do not feel particularly sad but lose interest in doing things they used to enjoy (anhedonia). Major depressive disorder also can cause physical symptoms. It can interfere with work, school, relationships, and other normal everyday activities. The disorder varies in severity but is longer lasting and more serious than the sadness we all feel from time to time in our lives. Major depressive disorder often is triggered by stressful life events or major life changes. Examples of these triggers include divorce, loss of your job or home, a move, and the death of a family member or close friend. Sometimes this disorder occurs for no obvious reason at all. People who have family members with major depressive disorder or bipolar disorder are at higher risk for developing this disorder, with or without life stressors. Major depressive disorder can occur at any age. It may occur just once in your life (single episode major depressive disorder). It may occur multiple times (recurrent major depressive disorder). SYMPTOMS People with major depressive disorder have either anhedonia or depressed mood on nearly a daily basis for at least 2 weeks or longer. Symptoms of depressed mood include:  Feelings of sadness (blue or down in the dumps) or emptiness.  Feelings of hopelessness or helplessness.  Tearfulness or episodes of crying (may be observed by others).  Irritability (children and adolescents). In addition to depressed mood or anhedonia or both, people with this disorder have at least four of the following symptoms:  Difficulty sleeping or sleeping too much.   Significant change (increase or decrease) in appetite or weight.   Lack of energy or  motivation.  Feelings of guilt and worthlessness.   Difficulty concentrating, remembering, or making decisions.  Unusually slow movement (psychomotor retardation) or restlessness (as observed by others).   Recurrent wishes for death, recurrent thoughts of self-harm (suicide), or a suicide attempt. People with major depressive disorder commonly have persistent negative thoughts about themselves, other people, and the world. People with severe major depressive disorder may experiencedistorted beliefs or perceptions about the world (psychotic delusions). They also may see or hear things that are not real (psychotic hallucinations). DIAGNOSIS Major depressive disorder is diagnosed through an assessment by your health care provider. Your health care provider will ask aboutaspects of your daily life, such as mood,sleep, and appetite, to see if you have the diagnostic symptoms of major depressive disorder. Your health care provider may ask about your medical history and use of alcohol or drugs, including prescription medicines. Your health care provider also may do a physical exam and blood work. This is because certain medical conditions and the use of certain substances can cause major depressive disorder-like symptoms (secondary depression). Your health care provider also may refer you to a mental health specialist for further evaluation and treatment. TREATMENT It is important to recognize the symptoms of major depressive disorder and seek treatment. The following treatments can be prescribed for this disorder:   Medicine. Antidepressant medicines usually are prescribed. Antidepressant medicines are thought to correct chemical imbalances in the brain that are commonly associated with major depressive disorder. Other types of medicine may be added if the symptoms do not respond to antidepressant medicines alone or if psychotic delusions or hallucinations occur.  Talk therapy. Talk therapy can be  helpful in treating major depressive disorder by providing   support, education, and guidance. Certain types of talk therapy also can help with negative thinking (cognitive behavioral therapy) and with relationship issues that trigger this disorder (interpersonal therapy). A mental health specialist can help determine which treatment is best for you. Most people with major depressive disorder do well with a combination of medicine and talk therapy. Treatments involving electrical stimulation of the brain can be used in situations with extremely severe symptoms or when medicine and talk therapy do not work over time. These treatments include electroconvulsive therapy, transcranial magnetic stimulation, and vagal nerve stimulation.   This information is not intended to replace advice given to you by your health care provider. Make sure you discuss any questions you have with your health care provider.   Document Released: 02/28/2013 Document Revised: 11/24/2014 Document Reviewed: 02/28/2013 Elsevier Interactive Patient Education 2016 Elsevier Inc.  

## 2016-08-04 NOTE — Progress Notes (Signed)
Pre visit review using our clinic review tool, if applicable. No additional management support is needed unless otherwise documented below in the visit note. 

## 2016-08-04 NOTE — Assessment & Plan Note (Addendum)
Does acknowledge her anxiety and depression are no longer overwhelming. She did do some counseling which was helpful but had stopped for the summer. Is ready to restart. Lexapro 10 mg just once daily and will consider stopping further.

## 2016-08-13 NOTE — Assessment & Plan Note (Signed)
Doing well. Avoid offending foods, start probiotics. Do not eat large meals in late evening and consider raising head of bed.

## 2016-08-13 NOTE — Assessment & Plan Note (Signed)
Encouraged increased rest and hydration, add probiotics, zinc such as Coldeze or Xicam. Treat fevers as needed. Augmentin if symptoms worsen

## 2016-08-13 NOTE — Progress Notes (Signed)
Patient ID: Ruth Cole, female   DOB: 1976/01/31, 40 y.o.   MRN: [id number]   Subjective:    Patient ID: Ruth Cole, female    DOB: [birth year], 40 y.o.   MRN: [id number]  Chief Complaint  Patient presents with  . Follow-up    HPI Patient is in today for follow up. Her depression is doing better. She did some counseling stopped for awhile and is considering restarting. Noting also some congestion, cough sometimes productive this week. Denies CP/palp/SOB/HA/fevers/GI or GU c/o. Taking meds as prescribed  Past Medical History:  Diagnosis Date  . Acute upper respiratory infection 11/22/2013  . Allergy    seasonal  . Chicken pox as a child  . Depression with anxiety 12/23/2015  . H/O sinusitis    improved s/p surgical repair of deviated septum  . History of chicken pox   . IBS (irritable bowel syndrome) 02/02/2012  . Insomnia 02/02/2012  . Miscarriage within last 12 months 12/12/2013  . Onychomycosis 10/02/2013  . PONV (postoperative nausea and vomiting)   . Postpartum care following vaginal delivery (11/15) 10/01/2014  . Preventative health care 02/02/2012  . Urinary incontinence    s/p 3rd vaginal delivery, occasional  . Vaginal delivery    X 3    Past Surgical History:  Procedure Laterality Date  . CLEFT PALATE REPAIR  as a child  . deviated septum repair  teenager  . DILATION AND EVACUATION N/A 02/24/2013   Procedure: DILATATION AND EVACUATION;  Surgeon: Princess Bruins, MD;  Location: [facility];  Service: Gynecology;  Laterality: N/A;  . DILATION AND EVACUATION N/A 08/24/2013   Procedure: DILATATION AND EVACUATION;  Surgeon: Princess Bruins, MD;  Location: [facility];  Service: Gynecology;  Laterality: N/A;  . MANDIBLE FRACTURE SURGERY  2008  . skin biopsy     multiple all benign    Family History  Problem Relation Age of Onset  . Hypertension Mother   . Hyperlipidemia Mother   . Diabetes Father     type 2  . Kidney disease Father   . Alcohol abuse Father   .  Stroke Maternal Grandmother     few  . Cancer Maternal Grandfather     stomach  . Heart attack Maternal Grandfather     few  . Cancer Paternal Grandfather     lung- nonsmoker    Social History   Social History  . Marital status: Married    Spouse name: N/A  . Number of children: N/A  . Years of education: N/A   Occupational History  . Not on file.   Social History Main Topics  . Smoking status: Never Smoker  . Smokeless tobacco: Never Used  . Alcohol use No  . Drug use: No  . Sexual activity: Yes    Partners: Male     Comment: approx [redacted] wks gestation   Other Topics Concern  . Not on file   Social History Narrative  . No narrative on file    Outpatient Medications Prior to Visit  Medication Sig Dispense Refill  . Prenatal Vit-Fe Fumarate-FA (PRENATAL MULTIVITAMIN) TABS tablet Take 1 tablet by mouth daily at 12 noon.    . Probiotic Product (PROBIOTIC DAILY PO) Take by mouth.    . escitalopram (LEXAPRO) 10 MG tablet Take 1 tablet (10 mg total) by mouth 2 (two) times daily. 180 tablet 1   No facility-administered medications prior to visit.     No Known Allergies  Review of Systems  Constitutional: Positive  for malaise/fatigue. Negative for fever.  HENT: Positive for congestion.   Eyes: Negative for blurred vision.  Respiratory: Positive for cough and sputum production. Negative for shortness of breath.   Cardiovascular: Negative for chest pain, palpitations and leg swelling.  Gastrointestinal: Positive for heartburn. Negative for abdominal pain, blood in stool, constipation and nausea.  Genitourinary: Negative for dysuria and frequency.  Musculoskeletal: Negative for falls.  Skin: Negative for rash.  Neurological: Negative for dizziness, loss of consciousness and headaches.  Endo/Heme/Allergies: Negative for environmental allergies.  Psychiatric/Behavioral: Negative for depression. The patient is not nervous/anxious.        Objective:    Physical Exam    Constitutional: She is oriented to person, place, and time. She appears well-developed and well-nourished. No distress.  HENT:  Head: Normocephalic and atraumatic.  Nose: Nose normal.  Eyes: Right eye exhibits no discharge. Left eye exhibits no discharge.  Neck: Normal range of motion. Neck supple.  Cardiovascular: Normal rate and regular rhythm.   No murmur heard. Pulmonary/Chest: Effort normal and breath sounds normal.  Abdominal: Soft. Bowel sounds are normal. There is no tenderness.  Musculoskeletal: She exhibits no edema.  Neurological: She is alert and oriented to person, place, and time.  Skin: Skin is warm and dry.  Psychiatric: She has a normal mood and affect.  Nursing note and vitals reviewed.   BP 109/71 (BP Location: Left Arm, Patient Position: Sitting, Cuff Size: Normal)   Pulse 94   Temp 97.8 F (36.6 C) (Oral)   Ht 5\' 4"  (1.626 m)   Wt 148 lb 4 oz (67.2 kg)   SpO2 99%   BMI 25.45 kg/m  Wt Readings from Last 3 Encounters:  08/04/16 148 lb 4 oz (67.2 kg)  04/03/16 141 lb 4 oz (64.1 kg)  02/07/16 139 lb (63 kg)     Lab Results  Component Value Date   WBC 8.2 12/19/2015   HGB 13.7 12/19/2015   HCT 40.8 12/19/2015   PLT 307.0 12/19/2015   GLUCOSE 94 12/19/2015   CHOL 99 12/19/2015   TRIG 184.0 (H) 12/19/2015   HDL 34.50 (L) 12/19/2015   LDLCALC 28 12/19/2015   ALT 10 12/19/2015   AST 15 12/19/2015   NA 140 12/19/2015   K 3.9 12/19/2015   CL 104 12/19/2015   CREATININE 0.57 12/19/2015   BUN 9 12/19/2015   CO2 30 12/19/2015   TSH 1.02 12/19/2015    Lab Results  Component Value Date   TSH 1.02 12/19/2015   Lab Results  Component Value Date   WBC 8.2 12/19/2015   HGB 13.7 12/19/2015   HCT 40.8 12/19/2015   MCV 91.7 12/19/2015   PLT 307.0 12/19/2015   Lab Results  Component Value Date   NA 140 12/19/2015   K 3.9 12/19/2015   CO2 30 12/19/2015   GLUCOSE 94 12/19/2015   BUN 9 12/19/2015   CREATININE 0.57 12/19/2015   BILITOT 0.4  12/19/2015   ALKPHOS 52 12/19/2015   AST 15 12/19/2015   ALT 10 12/19/2015   PROT 6.6 12/19/2015   ALBUMIN 3.8 12/19/2015   CALCIUM 8.9 12/19/2015   GFR 125.37 12/19/2015   Lab Results  Component Value Date   CHOL 99 12/19/2015   Lab Results  Component Value Date   HDL 34.50 (L) 12/19/2015   Lab Results  Component Value Date   LDLCALC 28 12/19/2015   Lab Results  Component Value Date   TRIG 184.0 (H) 12/19/2015   Lab Results  Component  Value Date   CHOLHDL 3 12/19/2015   No results found for: HGBA1C     Assessment & Plan:   Problem List Items Addressed This Visit    Acute upper respiratory infection    Encouraged increased rest and hydration, add probiotics, zinc such as Coldeze or Xicam. Treat fevers as needed. Augmentin if symptoms worsen      Depression with anxiety    Does acknowledge her anxiety and depression are no longer overwhelming. She did do some counseling which was helpful but had stopped for the summer. Is ready to restart. Lexapro 10 mg just once daily and will consider stopping further.       Relevant Medications   escitalopram (LEXAPRO) 10 MG tablet   Dyspepsia    Doing well. Avoid offending foods, start probiotics. Do not eat large meals in late evening and consider raising head of bed.        Other Visit Diagnoses    Depression       Relevant Medications   escitalopram (LEXAPRO) 10 MG tablet      I have changed Ms. Gockley's escitalopram. I am also having her start on amoxicillin-clavulanate. Additionally, I am having her maintain her prenatal multivitamin and Probiotic Product (PROBIOTIC DAILY PO).  Meds ordered this encounter  Medications  . escitalopram (LEXAPRO) 10 MG tablet    Sig: Take 1 tablet (10 mg total) by mouth daily.    Dispense:  180 tablet    Refill:  1  . amoxicillin-clavulanate (AUGMENTIN) 875-125 MG tablet    Sig: Take 1 tablet by mouth 2 (two) times daily.    Dispense:  20 tablet    Refill:  0     [name], MD

## 2016-08-18 MED FILL — ESCITALOPRAM 10 MG TABLET: 10 | 90 days supply | Qty: 180 | Fill #1

## 2016-09-11 ENCOUNTER — Ambulatory Visit (INDEPENDENT_AMBULATORY_CARE_PROVIDER_SITE_OTHER): Payer: 59 | Admitting: Psychology

## 2016-09-11 DIAGNOSIS — F331 Major depressive disorder, recurrent, moderate: Secondary | ICD-10-CM | POA: Diagnosis not present

## 2016-09-23 ENCOUNTER — Ambulatory Visit (INDEPENDENT_AMBULATORY_CARE_PROVIDER_SITE_OTHER): Payer: 59 | Admitting: Psychology

## 2016-09-23 DIAGNOSIS — F331 Major depressive disorder, recurrent, moderate: Secondary | ICD-10-CM

## 2016-10-07 ENCOUNTER — Ambulatory Visit (INDEPENDENT_AMBULATORY_CARE_PROVIDER_SITE_OTHER): Payer: 59 | Admitting: Psychology

## 2016-10-07 DIAGNOSIS — F331 Major depressive disorder, recurrent, moderate: Secondary | ICD-10-CM

## 2016-10-12 ENCOUNTER — Telehealth: Payer: 59 | Admitting: Family

## 2016-10-12 DIAGNOSIS — J019 Acute sinusitis, unspecified: Secondary | ICD-10-CM

## 2016-10-12 MED ORDER — AMOXICILLIN-POT CLAVULANATE 875-125 MG PO TABS: 1.0000 | ORAL_TABLET | Freq: Two times a day (BID) | ORAL | 0 refills | Status: DC

## 2016-10-12 NOTE — Progress Notes (Signed)

## 2016-10-21 ENCOUNTER — Ambulatory Visit (INDEPENDENT_AMBULATORY_CARE_PROVIDER_SITE_OTHER): Payer: 59 | Admitting: Psychology

## 2016-10-21 DIAGNOSIS — F331 Major depressive disorder, recurrent, moderate: Secondary | ICD-10-CM

## 2016-11-04 ENCOUNTER — Ambulatory Visit (INDEPENDENT_AMBULATORY_CARE_PROVIDER_SITE_OTHER): Payer: 59 | Admitting: Psychology

## 2016-11-04 ENCOUNTER — Other Ambulatory Visit: Payer: Self-pay | Admitting: Family Medicine

## 2016-11-04 DIAGNOSIS — F331 Major depressive disorder, recurrent, moderate: Secondary | ICD-10-CM

## 2016-11-04 DIAGNOSIS — F32A Depression, unspecified: Secondary | ICD-10-CM

## 2016-11-04 DIAGNOSIS — F329 Major depressive disorder, single episode, unspecified: Secondary | ICD-10-CM

## 2016-11-04 MED FILL — ESCITALOPRAM 10 MG TABLET: 10 | 90 days supply | Qty: 180 | Fill #0

## 2016-11-05 ENCOUNTER — Ambulatory Visit (INDEPENDENT_AMBULATORY_CARE_PROVIDER_SITE_OTHER): Payer: 59 | Admitting: Medical

## 2016-11-05 ENCOUNTER — Encounter: Payer: Self-pay | Admitting: Medical

## 2016-11-05 VITALS — BP 98/62 | HR 88 | Temp 97.7°F | Resp 16 | Ht 64.0 in | Wt 153.4 lb

## 2016-11-05 DIAGNOSIS — R059 Cough, unspecified: Secondary | ICD-10-CM

## 2016-11-05 DIAGNOSIS — J0110 Acute frontal sinusitis, unspecified: Secondary | ICD-10-CM | POA: Diagnosis not present

## 2016-11-05 DIAGNOSIS — R05 Cough: Secondary | ICD-10-CM

## 2016-11-05 MED ORDER — HYDROCODONE-HOMATROPINE 5-1.5 MG/5ML PO SYRP: 5.0000 mL | ORAL_SOLUTION | Freq: Three times a day (TID) | ORAL | 0 refills | Status: DC | PRN

## 2016-11-05 MED ORDER — LEVOFLOXACIN 500 MG PO TABS: 500.0000 mg | ORAL_TABLET | Freq: Every day | ORAL | 0 refills | Status: DC

## 2016-11-05 MED FILL — levoFLOXacin 500 MG TABS: 500 | 7 days supply | Qty: 7 | Fill #0

## 2016-11-05 MED FILL — HYDROCODONE-HOMATROPINE SOL: 5-1.5 | 8 days supply | Qty: 120 | Fill #0

## 2016-11-05 NOTE — Patient Instructions (Addendum)
Your appear to have a sinus infection. I am prescribing  levofloxin antibiotic for the infection. To help with the nasal use flonase nasal steroid. For your associated cough, I prescribed cough medicine hycodan.   If you cough, fatigue and symptoms persist then would get cbc/labs and cxr.  Rest, hydrate, tylenol for fever.  Follow up in 7 days or as needed.

## 2016-11-05 NOTE — Progress Notes (Signed)
Subjective:    Patient ID: Ruth Cole, female    DOB: [birth year], 40 y.o.   MRN: [id number]  HPI    Pt in for some nasal and chest congestion. Pt reports some sinus pressure, fatigue and excessive cough. Pt states around thanksgiving similar symptoms started and she had e-visit and got augmentin. She states felt better but not completley. So overall symptoms for about one month. Felt like got some better with antibiotic but gradually got worse over past 3 weeks. Pt energy not back to normal stating still fatigued and she still has cough with sinus pressure.   Pt denies any wheezing.  She does state that her frontal sinus has been tender entire time. Feels pnd.  Pt has been taken claritin and flonase.  LMP- has mirena.   Review of Systems  Constitutional: Negative for chills, fatigue and fever.  HENT: Positive for congestion, postnasal drip, sinus pain and sinus pressure. Negative for ear pain and sore throat.   Respiratory: Negative for cough, chest tightness, shortness of breath and wheezing.   Cardiovascular: Negative for chest pain and palpitations.  Gastrointestinal: Negative for abdominal pain, constipation, nausea and vomiting.  Endocrine: Negative for polydipsia and polyuria.  Musculoskeletal: Negative for back pain.       Faint body aches for 3 weeks. Not acute achiness.  Skin: Negative for rash.  Neurological: Negative for dizziness and headaches.  Hematological: Negative for adenopathy. Does not bruise/bleed easily.  Psychiatric/Behavioral: Negative for behavioral problems and confusion.    Past Medical History:  Diagnosis Date  . Acute upper respiratory infection 11/22/2013  . Allergy    seasonal  . Chicken pox as a child  . Depression with anxiety 12/23/2015  . H/O sinusitis    improved s/p surgical repair of deviated septum  . History of chicken pox   . IBS (irritable bowel syndrome) 02/02/2012  . Insomnia 02/02/2012  . Miscarriage within last 12 months  12/12/2013  . Onychomycosis 10/02/2013  . PONV (postoperative nausea and vomiting)   . Postpartum care following vaginal delivery (11/15) 10/01/2014  . Preventative health care 02/02/2012  . Urinary incontinence    s/p 3rd vaginal delivery, occasional  . Vaginal delivery    X 3     Social History   Social History  . Marital status: Married    Spouse name: N/A  . Number of children: N/A  . Years of education: N/A   Occupational History  . Not on file.   Social History Main Topics  . Smoking status: Never Smoker  . Smokeless tobacco: Never Used  . Alcohol use No  . Drug use: No  . Sexual activity: Yes    Partners: Male     Comment: approx [redacted] wks gestation   Other Topics Concern  . Not on file   Social History Narrative  . No narrative on file    Past Surgical History:  Procedure Laterality Date  . CLEFT PALATE REPAIR  as a child  . deviated septum repair  teenager  . DILATION AND EVACUATION N/A 02/24/2013   Procedure: DILATATION AND EVACUATION;  Surgeon: Princess Bruins, MD;  Location: [facility];  Service: Gynecology;  Laterality: N/A;  . DILATION AND EVACUATION N/A 08/24/2013   Procedure: DILATATION AND EVACUATION;  Surgeon: Princess Bruins, MD;  Location: [facility];  Service: Gynecology;  Laterality: N/A;  . MANDIBLE FRACTURE SURGERY  2008  . skin biopsy     multiple all benign    Family History  Problem  Relation Age of Onset  . Hypertension Mother   . Hyperlipidemia Mother   . Diabetes Father     type 2  . Kidney disease Father   . Alcohol abuse Father   . Stroke Maternal Grandmother     few  . Cancer Maternal Grandfather     stomach  . Heart attack Maternal Grandfather     few  . Cancer Paternal Grandfather     lung- nonsmoker    No Known Allergies  Current Outpatient Prescriptions on File Prior to Visit  Medication Sig Dispense Refill  . escitalopram (LEXAPRO) 10 MG tablet TAKE 1 TABLET (10 MG TOTAL) BY MOUTH 2 TIMES DAILY. 180 tablet 1  .  Probiotic Product (PROBIOTIC DAILY PO) Take by mouth.     No current facility-administered medications on file prior to visit.     BP 98/62 (BP Location: Left Arm, Patient Position: Sitting, Cuff Size: Large)   Pulse 88   Temp 97.7 F (36.5 C) (Oral)   Resp 16   Ht 5\' 4"  (1.626 m)   Wt 153 lb 6 oz (69.6 kg)   SpO2 98%   BMI 26.33 kg/m       Objective:   Physical Exam  General  Mental Status - Alert. General Appearance - Well groomed. Not in acute distress.  Skin Rashes- No Rashes.  HEENT Head- Normal. Ear Auditory Canal - Left- Normal. Right - Normal.Tympanic Membrane- Left- Normal. Right- Normal. Eye Sclera/Conjunctiva- Left- Normal. Right- Normal. Nose & Sinuses Nasal Mucosa- Left-  Boggy and Congested. Right-  Boggy and  Congested.Bilateral maxillary and frontal sinus pressure. Mouth & Throat Lips: Upper Lip- Normal: no dryness, cracking, pallor, cyanosis, or vesicular eruption. Lower Lip-Normal: no dryness, cracking, pallor, cyanosis or vesicular eruption. Buccal Mucosa- Bilateral- No Aphthous ulcers. Oropharynx- No Discharge or Erythema. Tonsils: Characteristics- Bilateral- No Erythema or Congestion. Size/Enlargement- Bilateral- No enlargement. Discharge- bilateral-None.  Neck Neck- Supple. No Masses.   Chest and Lung Exam Auscultation: Breath Sounds:-Clear even and unlabored.  Cardiovascular Auscultation:Rythm- Regular, rate and rhythm. Murmurs & Other Heart Sounds:Ausculatation of the heart reveal- No Murmurs.  Lymphatic Head & Neck General Head & Neck Lymphatics: Bilateral: Description- No Localized lymphadenopathy.       Assessment & Plan:  Your appear to have a sinus infection. I am prescribing  levofloxin antibiotic for the infection. To help with the nasal use flonase nasal steroid. For your associated cough, I prescribed cough medicine hycodan.   If you cough, fatigue and symptoms persist then would get cbc/labs and cxr.  Rest, hydrate,  tylenol for fever.  Follow up in 7 days or as needed.  Discussed choice of antibiotic with patient. 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    Robin Petrakis, Percell Miller, PA-C

## 2016-11-05 NOTE — Progress Notes (Signed)
3.6Pre visit review using our clinic review tool, if applicable. No additional management support is needed unless otherwise documented below in the visit note/SLS

## 2016-11-15 ENCOUNTER — Encounter: Payer: Self-pay | Admitting: Emergency Medicine

## 2016-11-15 ENCOUNTER — Emergency Department
Admission: EM | Admit: 2016-11-15 | Discharge: 2016-11-15 | Disposition: A | Discharge status: Home / Self Care | Payer: 59 | Source: Home / Self Care | Attending: Family Medicine | Admitting: Family Medicine

## 2016-11-15 DIAGNOSIS — R05 Cough: Secondary | ICD-10-CM | POA: Diagnosis not present

## 2016-11-15 DIAGNOSIS — R5383 Other fatigue: Secondary | ICD-10-CM | POA: Diagnosis not present

## 2016-11-15 DIAGNOSIS — R0981 Nasal congestion: Secondary | ICD-10-CM

## 2016-11-15 DIAGNOSIS — R053 Chronic cough: Secondary | ICD-10-CM

## 2016-11-15 LAB — COMPLETE METABOLIC PANEL WITH GFR
ALT: 12 U/L (ref 6–29)
AST: 15 U/L (ref 10–30)
Albumin: 4.3 g/dL (ref 3.6–5.1)
Alkaline Phosphatase: 44 U/L (ref 33–115)
BUN: 10 mg/dL (ref 7–25)
CO2: 29 mmol/L (ref 20–31)
Calcium: 9.3 mg/dL (ref 8.6–10.2)
Chloride: 103 mmol/L (ref 98–110)
Creat: 0.56 mg/dL (ref 0.50–1.10)
GFR, Est African American: >89 mL/min (ref >=60)
GFR, Est Non African American: >89 mL/min (ref >=60)
Glucose, Bld: 88 mg/dL (ref 65–99)
Potassium: 3.9 mmol/L (ref 3.5–5.3)
Sodium: 138 mmol/L (ref 135–146)
Total Bilirubin: 0.4 mg/dL (ref 0.2–1.2)
Total Protein: 6.9 g/dL (ref 6.1–8.1)

## 2016-11-15 LAB — POCT CBC W AUTO DIFF (K'VILLE URGENT CARE)

## 2016-11-15 LAB — POCT MONO SCREEN (KUC): Mono, POC: NEGATIVE

## 2016-11-15 MED ORDER — AZITHROMYCIN 250 MG PO TABS: 250.0000 mg | ORAL_TABLET | Freq: Every day | ORAL | 0 refills | Status: DC

## 2016-11-15 MED ORDER — HYDROCODONE-HOMATROPINE 5-1.5 MG/5ML PO SYRP: 5.0000 mL | ORAL_SOLUTION | Freq: Four times a day (QID) | ORAL | 0 refills | Status: DC | PRN

## 2016-11-15 NOTE — ED Provider Notes (Signed)
CSN: [id number]     Arrival date & time 11/15/16  1652 History   First MD Initiated Contact with Patient 11/15/16 1725     Chief Complaint  Patient presents with  . Cough   (Consider location/radiation/quality/duration/timing/severity/associated sxs/prior Treatment) HPI Ruth Cole is a 40 y.o. female presenting to UC with c/o persistent cough for about 2 months as well as fatigue, sinus congestion and pressure and swollen lymph nodes in her neck. Pt initially did a televisit and had Augmentin called in for a likely sinus infection.  Symptoms did not improve so she f/u with her PCP who prescribed Levaquin and advised if still not better, a CXR and labs would be performed. Pt reports mild chest soreness from cough but otherwise the fatigue is most bothersome for her.  Denies sore throat at this time. Denies hx of mono.  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 Denies fever, chills, n/vd/.    Past Medical History:  Diagnosis Date  . Acute upper respiratory infection 11/22/2013  . Allergy    seasonal  . Chicken pox as a child  . Depression with anxiety 12/23/2015  . H/O sinusitis    improved s/p surgical repair of deviated septum  . History of chicken pox   . IBS (irritable bowel syndrome) 02/02/2012  . Insomnia 02/02/2012  . Miscarriage within last 12 months 12/12/2013  . Onychomycosis 10/02/2013  . PONV (postoperative nausea and vomiting)   . Postpartum care following vaginal delivery (11/15) 10/01/2014  . Preventative health care 02/02/2012  . Urinary incontinence    s/p 3rd vaginal delivery, occasional  . Vaginal delivery    X 3   Past Surgical History:  Procedure Laterality Date  . CLEFT PALATE REPAIR  as a child  . deviated septum repair  teenager  . DILATION AND EVACUATION N/A 02/24/2013   Procedure: DILATATION AND EVACUATION;  Surgeon: Princess Bruins, MD;  Location: [facility];  Service: Gynecology;  Laterality: N/A;  . DILATION AND  EVACUATION N/A 08/24/2013   Procedure: DILATATION AND EVACUATION;  Surgeon: Princess Bruins, MD;  Location: [facility];  Service: Gynecology;  Laterality: N/A;  . MANDIBLE FRACTURE SURGERY  2008  . skin biopsy     multiple all benign   Family History  Problem Relation Age of Onset  . Hypertension Mother   . Hyperlipidemia Mother   . Diabetes Father     type 2  . Kidney disease Father   . Alcohol abuse Father   . Stroke Maternal Grandmother     few  . Cancer Maternal Grandfather     stomach  . Heart attack Maternal Grandfather     few  . Cancer Paternal Grandfather     lung- nonsmoker   Social History  Substance Use Topics  . Smoking status: Never Smoker  . Smokeless tobacco: Never Used  . Alcohol use No   OB History    Gravida Para Term Preterm AB Living   7 4 3 1 3 4    SAB TAB Ectopic Multiple Live Births   3 0 0 0 4     Review of Systems  Constitutional: Positive for fatigue. Negative for chills and fever.  HENT: Positive for congestion, postnasal drip and sinus pressure. Negative for ear pain, sore throat, trouble swallowing and voice change.   Respiratory: Positive for cough. Negative for shortness of breath.   Cardiovascular: Negative for chest pain and palpitations.  Gastrointestinal: Negative for  abdominal pain, diarrhea, nausea and vomiting.  Musculoskeletal: Positive for arthralgias and myalgias. Negative for back pain.       Body aches  Skin: Negative for rash.  Neurological: Positive for headaches. Negative for dizziness and light-headedness.    Allergies  Patient has no known allergies.  Home Medications   Prior to Admission medications   Medication Sig Start Date End Date Taking? Authorizing Provider  azithromycin (ZITHROMAX) 250 MG tablet Take 1 tablet (250 mg total) by mouth daily. Take first 2 tablets together, then 1 every day until finished. 11/15/16   Noland Fordyce, PA-C  DM-Doxylamine-Acetaminophen (NYQUIL COLD & FLU PO) Take by mouth at  bedtime as needed.    Historical Provider, MD  escitalopram (LEXAPRO) 10 MG tablet TAKE 1 TABLET (10 MG TOTAL) BY MOUTH 2 TIMES DAILY. 11/04/16   Mosie Lukes, MD  fluticasone (FLONASE) 50 MCG/ACT nasal spray Place 2 sprays into both nostrils daily.    Historical Provider, MD  HYDROcodone-homatropine (HYCODAN) 5-1.5 MG/5ML syrup Take 5 mLs by mouth every 8 (eight) hours as needed for cough. 11/05/16   Percell Miller Saguier, PA-C  HYDROcodone-homatropine (HYCODAN) 5-1.5 MG/5ML syrup Take 5 mLs by mouth every 6 (six) hours as needed for cough. 11/15/16   Noland Fordyce, PA-C  levofloxacin (LEVAQUIN) 500 MG tablet Take 1 tablet (500 mg total) by mouth daily. 11/05/16   Percell Miller Saguier, PA-C  loratadine (CLARITIN) 10 MG tablet Take 10 mg by mouth daily.    Historical Provider, MD  Multiple Vitamins-Minerals (WOMENS MULTIVITAMIN) TABS Take by mouth daily.    Historical Provider, MD  Probiotic Product (PROBIOTIC DAILY PO) Take by mouth.    Historical Provider, MD   Meds Ordered and Administered this Visit  Medications - No data to display  BP 114/78 (BP Location: Right Arm)   Pulse 90   Temp 97.8 F (36.6 C) (Oral)   Wt 150 lb (68 kg)   SpO2 100%   BMI 25.75 kg/m  No data found.   Physical Exam  Constitutional: She appears well-developed and well-nourished. No distress.  HENT:  Head: Normocephalic and atraumatic.  Right Ear: Tympanic membrane normal.  Left Ear: Tympanic membrane normal.  Nose: Mucosal edema present. Right sinus exhibits no maxillary sinus tenderness and no frontal sinus tenderness. Left sinus exhibits no maxillary sinus tenderness and no frontal sinus tenderness.  Mouth/Throat: Uvula is midline, oropharynx is clear and moist and mucous membranes are normal.  Eyes: Conjunctivae are normal. No scleral icterus.  Neck: Normal range of motion. Neck supple.  Cardiovascular: Normal rate, regular rhythm and normal heart sounds.   Pulmonary/Chest: Effort normal and breath sounds normal.  No stridor. No respiratory distress. She has no wheezes. She has no rales.  Abdominal: Soft. She exhibits no distension. There is no tenderness.  Musculoskeletal: Normal range of motion.  Lymphadenopathy:    She has cervical adenopathy.  Neurological: She is alert.  Skin: Skin is warm and dry. No rash noted. She is not diaphoretic. No erythema.  Nursing note and vitals reviewed.   Urgent Care Course   Clinical Course     Procedures (including critical care time)  Labs Review Labs Reviewed  COMPLETE METABOLIC PANEL WITH GFR  EPSTEIN-BARR VIRUS VCA, IGM  EPSTEIN-BARR VIRUS VCA, IGG  EPSTEIN-BARR VIRUS NUCLEAR ANTIGEN ANTIBODY, IGG  EPSTEIN-BARR VIRUS EARLY D ANTIGEN ANTIBODY, IGG  POCT MONO SCREEN ([facility])  POCT CBC W AUTO DIFF ([facility])    Imaging Review No results found.    MDM  1. Persistent cough for 3 weeks or longer   2. Fatigue, unspecified type   3. Nasal congestion    Pt c/o about 2 months of congestion, fatigue, and cough. She has completed an Augmentin and Levaquin treatment w/o much relief.  Lungs: CTAB O2 Sat 100% on RA  Discussed CXR, doubt pneumonia. Pt agrees, will hold off on CXR at this time.  Mono spot- Negative CBC- slight elevated WBC at 11.1 CMP and mono tests pending.    Due to duration of symptoms, will cover for atypical bacteria and possible pertussis with Azithromycin  F/u with PCP next week for further evaluation of symptoms if not improving.    Noland Fordyce, PA-C 11/15/16 1901

## 2016-11-15 NOTE — ED Triage Notes (Signed)
Pt states she has been treated with augmentin and levaquin for cough and sinus pressure twice. Reports no improvement. States this has been happening for 2 months.

## 2016-11-18 ENCOUNTER — Ambulatory Visit: Payer: 59 | Admitting: Psychology

## 2016-11-18 LAB — EPSTEIN-BARR VIRUS NUCLEAR ANTIGEN ANTIBODY, IGG: EBV NA IgG: 77.6 U/mL — ABNORMAL HIGH

## 2016-11-18 LAB — EPSTEIN-BARR VIRUS VCA, IGG: EBV VCA IgG: 187 U/mL — ABNORMAL HIGH

## 2016-11-18 LAB — EPSTEIN-BARR VIRUS EARLY D ANTIGEN ANTIBODY, IGG: EBV EA IgG: 55.8 U/mL — ABNORMAL HIGH

## 2016-11-18 LAB — EPSTEIN-BARR VIRUS VCA, IGM: EBV VCA IgM: <36 U/mL

## 2016-11-19 ENCOUNTER — Telehealth: Payer: Self-pay | Admitting: Emergency Medicine

## 2016-12-01 ENCOUNTER — Ambulatory Visit: Payer: 59 | Admitting: Psychology

## 2016-12-10 ENCOUNTER — Ambulatory Visit (INDEPENDENT_AMBULATORY_CARE_PROVIDER_SITE_OTHER): Payer: 59 | Admitting: Psychology

## 2016-12-10 DIAGNOSIS — F331 Major depressive disorder, recurrent, moderate: Secondary | ICD-10-CM | POA: Diagnosis not present

## 2016-12-23 ENCOUNTER — Ambulatory Visit (INDEPENDENT_AMBULATORY_CARE_PROVIDER_SITE_OTHER): Payer: 59 | Admitting: Psychology

## 2016-12-23 DIAGNOSIS — F331 Major depressive disorder, recurrent, moderate: Secondary | ICD-10-CM | POA: Diagnosis not present

## 2016-12-26 ENCOUNTER — Ambulatory Visit (INDEPENDENT_AMBULATORY_CARE_PROVIDER_SITE_OTHER): Payer: 59 | Admitting: Psychology

## 2016-12-26 DIAGNOSIS — F341 Dysthymic disorder: Secondary | ICD-10-CM

## 2017-01-12 ENCOUNTER — Ambulatory Visit: Payer: Self-pay | Admitting: Family Medicine

## 2017-01-19 ENCOUNTER — Ambulatory Visit (INDEPENDENT_AMBULATORY_CARE_PROVIDER_SITE_OTHER): Payer: 59 | Admitting: Psychology

## 2017-01-19 DIAGNOSIS — F341 Dysthymic disorder: Secondary | ICD-10-CM | POA: Diagnosis not present

## 2017-01-23 ENCOUNTER — Ambulatory Visit (INDEPENDENT_AMBULATORY_CARE_PROVIDER_SITE_OTHER): Payer: 59 | Admitting: Psychology

## 2017-01-23 DIAGNOSIS — F331 Major depressive disorder, recurrent, moderate: Secondary | ICD-10-CM

## 2017-01-24 ENCOUNTER — Encounter: Payer: Self-pay | Admitting: Family Medicine

## 2017-01-27 ENCOUNTER — Ambulatory Visit: Payer: 59 | Admitting: Psychology

## 2017-01-27 ENCOUNTER — Other Ambulatory Visit: Payer: Self-pay | Admitting: Family Medicine

## 2017-01-27 MED ORDER — BUPROPION HCL ER (XL) 150 MG PO TB24: 150.0000 mg | ORAL_TABLET | Freq: Every day | ORAL | 0 refills | Status: DC

## 2017-01-28 DIAGNOSIS — Z6826 Body mass index (BMI) 26.0-26.9, adult: Secondary | ICD-10-CM | POA: Diagnosis not present

## 2017-01-28 DIAGNOSIS — Z01419 Encounter for gynecological examination (general) (routine) without abnormal findings: Secondary | ICD-10-CM | POA: Diagnosis not present

## 2017-01-28 DIAGNOSIS — Z1231 Encounter for screening mammogram for malignant neoplasm of breast: Secondary | ICD-10-CM | POA: Diagnosis not present

## 2017-01-29 ENCOUNTER — Ambulatory Visit (INDEPENDENT_AMBULATORY_CARE_PROVIDER_SITE_OTHER): Payer: 59 | Admitting: Psychology

## 2017-01-29 DIAGNOSIS — F341 Dysthymic disorder: Secondary | ICD-10-CM | POA: Diagnosis not present

## 2017-01-30 ENCOUNTER — Ambulatory Visit: Payer: 59 | Admitting: Psychology

## 2017-02-02 ENCOUNTER — Encounter: Payer: Self-pay | Admitting: Family Medicine

## 2017-02-02 ENCOUNTER — Ambulatory Visit (INDEPENDENT_AMBULATORY_CARE_PROVIDER_SITE_OTHER): Payer: 59 | Admitting: Family Medicine

## 2017-02-02 ENCOUNTER — Ambulatory Visit (INDEPENDENT_AMBULATORY_CARE_PROVIDER_SITE_OTHER): Payer: 59 | Admitting: Psychology

## 2017-02-02 VITALS — BP 113/77 | HR 75 | Temp 98.6°F | Ht 64.0 in | Wt 154.2 lb

## 2017-02-02 DIAGNOSIS — E782 Mixed hyperlipidemia: Secondary | ICD-10-CM | POA: Diagnosis not present

## 2017-02-02 DIAGNOSIS — F329 Major depressive disorder, single episode, unspecified: Secondary | ICD-10-CM

## 2017-02-02 DIAGNOSIS — F331 Major depressive disorder, recurrent, moderate: Secondary | ICD-10-CM | POA: Diagnosis not present

## 2017-02-02 DIAGNOSIS — F419 Anxiety disorder, unspecified: Principal | ICD-10-CM

## 2017-02-02 DIAGNOSIS — F32A Depression, unspecified: Secondary | ICD-10-CM

## 2017-02-02 DIAGNOSIS — T7840XD Allergy, unspecified, subsequent encounter: Secondary | ICD-10-CM

## 2017-02-02 DIAGNOSIS — K589 Irritable bowel syndrome without diarrhea: Secondary | ICD-10-CM | POA: Diagnosis not present

## 2017-02-02 DIAGNOSIS — Z0000 Encounter for general adult medical examination without abnormal findings: Secondary | ICD-10-CM | POA: Diagnosis not present

## 2017-02-02 DIAGNOSIS — E663 Overweight: Secondary | ICD-10-CM | POA: Diagnosis not present

## 2017-02-02 DIAGNOSIS — F418 Other specified anxiety disorders: Secondary | ICD-10-CM

## 2017-02-02 HISTORY — DX: Overweight: E66.3

## 2017-02-02 LAB — COMPREHENSIVE METABOLIC PANEL
ALT: 14 U/L (ref 0–35)
AST: 20 U/L (ref 0–37)
Albumin: 4.3 g/dL (ref 3.5–5.2)
Alkaline Phosphatase: 48 U/L (ref 39–117)
BUN: 13 mg/dL (ref 6–23)
CO2: 28 mEq/L (ref 19–32)
Calcium: 9.1 mg/dL (ref 8.4–10.5)
Chloride: 103 mEq/L (ref 96–112)
Creatinine, Ser: 0.59 mg/dL (ref 0.40–1.20)
GFR: 119.79 mL/min (ref >60.00)
Glucose, Bld: 95 mg/dL (ref 70–99)
Potassium: 4 mEq/L (ref 3.5–5.1)
Sodium: 138 mEq/L (ref 135–145)
Total Bilirubin: 0.6 mg/dL (ref 0.2–1.2)
Total Protein: 7 g/dL (ref 6.0–8.3)

## 2017-02-02 LAB — LIPID PANEL
Cholesterol: 136 mg/dL (ref 0–200)
HDL: 44.9 mg/dL (ref >39.00)
LDL Cholesterol: 80 mg/dL (ref 0–99)
NonHDL: 91.04
Total CHOL/HDL Ratio: 3
Triglycerides: 53 mg/dL (ref 0.0–149.0)
VLDL: 10.6 mg/dL (ref 0.0–40.0)

## 2017-02-02 LAB — CBC
HCT: 42.1 % (ref 36.0–46.0)
Hemoglobin: 14.1 g/dL (ref 12.0–15.0)
MCHC: 33.5 g/dL (ref 30.0–36.0)
MCV: 93.1 fl (ref 78.0–100.0)
Platelets: 345 10*3/uL (ref 150.0–400.0)
RBC: 4.52 Mil/uL (ref 3.87–5.11)
RDW: 12.3 % (ref 11.5–15.5)
WBC: 9.5 10*3/uL (ref 4.0–10.5)

## 2017-02-02 LAB — TSH: TSH: 0.58 u[IU]/mL (ref 0.35–4.50)

## 2017-02-02 MED ORDER — FLUTICASONE PROPIONATE 50 MCG/ACT NA SUSP: 2.0000 | Freq: Every day | NASAL | 6 refills | Status: AC | PRN

## 2017-02-02 MED ORDER — BUPROPION HCL ER (XL) 300 MG PO TB24: 300.0000 mg | ORAL_TABLET | Freq: Every day | ORAL | 4 refills | Status: DC

## 2017-02-02 MED ORDER — CETIRIZINE HCL 10 MG PO TABS: 10.0000 mg | ORAL_TABLET | Freq: Every day | ORAL | 5 refills | Status: AC | PRN

## 2017-02-02 MED FILL — ALL DAY ALLERGY 10 MG TAB: 10 | 100 days supply | Qty: 100 | Fill #0

## 2017-02-02 MED FILL — BUPROPION HCL XL 300 MG TAB: 300 | 30 days supply | Qty: 30 | Fill #0

## 2017-02-02 NOTE — Patient Instructions (Signed)
Preventive Care 18-39 Years, Female Preventive care refers to lifestyle choices and visits with your health care provider that can promote health and wellness. What does preventive care include?  A yearly physical exam. This is also called an annual well check.  Dental exams once or twice a year.  Routine eye exams. Ask your health care provider how often you should have your eyes checked.  Personal lifestyle choices, including:  Daily care of your teeth and gums.  Regular physical activity.  Eating a healthy diet.  Avoiding tobacco and drug use.  Limiting alcohol use.  Practicing safe sex.  Taking vitamin and mineral supplements as recommended by your health care provider. What happens during an annual well check? The services and screenings done by your health care provider during your annual well check will depend on your age, overall health, lifestyle risk factors, and family history of disease. Counseling  Your health care provider may ask you questions about your:  Alcohol use.  Tobacco use.  Drug use.  Emotional well-being.  Home and relationship well-being.  Sexual activity.  Eating habits.  Work and work environment.  Method of birth control.  Menstrual cycle.  Pregnancy history. Screening  You may have the following tests or measurements:  Height, weight, and BMI.  Diabetes screening. This is done by checking your blood sugar (glucose) after you have not eaten for a while (fasting).  Blood pressure.  Lipid and cholesterol levels. These may be checked every 5 years starting at age 20.  Skin check.  Hepatitis C blood test.  Hepatitis B blood test.  Sexually transmitted disease (STD) testing.  BRCA-related cancer screening. This may be done if you have a family history of breast, ovarian, tubal, or peritoneal cancers.  Pelvic exam and Pap test. This may be done every 3 years starting at age 21. Starting at age 30, this may be done every 5  years if you have a Pap test in combination with an HPV test. Discuss your test results, treatment options, and if necessary, the need for more tests with your health care provider. Vaccines  Your health care provider may recommend certain vaccines, such as:  Influenza vaccine. This is recommended every year.  Tetanus, diphtheria, and acellular pertussis (Tdap, Td) vaccine. You may need a Td booster every 10 years.  Varicella vaccine. You may need this if you have not been vaccinated.  HPV vaccine. If you are 26 or younger, you may need three doses over 6 months.  Measles, mumps, and rubella (MMR) vaccine. You may need at least one dose of MMR. You may also need a second dose.  Pneumococcal 13-valent conjugate (PCV13) vaccine. You may need this if you have certain conditions and were not previously vaccinated.  Pneumococcal polysaccharide (PPSV23) vaccine. You may need one or two doses if you smoke cigarettes or if you have certain conditions.  Meningococcal vaccine. One dose is recommended if you are age 19-21 years and a first-year college student living in a residence hall, or if you have one of several medical conditions. You may also need additional booster doses.  Hepatitis A vaccine. You may need this if you have certain conditions or if you travel or work in places where you may be exposed to hepatitis A.  Hepatitis B vaccine. You may need this if you have certain conditions or if you travel or work in places where you may be exposed to hepatitis B.  Haemophilus influenzae type b (Hib) vaccine. You may need this   if you have certain risk factors. Talk to your health care provider about which screenings and vaccines you need and how often you need them. This information is not intended to replace advice given to you by your health care provider. Make sure you discuss any questions you have with your health care provider. Document Released: 12/30/2001 Document Revised: 07/23/2016  Document Reviewed: 09/04/2015 Elsevier Interactive Patient Education  2017 [employer].

## 2017-02-02 NOTE — Assessment & Plan Note (Signed)
Patient encouraged to maintain heart healthy diet, regular exercise, adequate sleep. Consider daily probiotics. Take medications as prescribed. Has advanced directives. Encouraged to bring in a copy. Labs perfomed today

## 2017-02-02 NOTE — Assessment & Plan Note (Signed)
Uses Flonase most. May add Zyrtec up to bid and can consider adding Singulair and Astelin as needed

## 2017-02-02 NOTE — Assessment & Plan Note (Addendum)
Met with nutritionist and has set up to start work outs with a personal trainer once a week but is frustrated with weight gain on SSRI, will try stopping med and patient instructed to look at [facility] or [facility] diet for guidance

## 2017-02-02 NOTE — Progress Notes (Signed)
Patient ID: Ruth Cole, female   DOB: [birth year], 41 y.o.   MRN: [id number]   Subjective:  I acted as a Education administrator for [name], [place], [place]   Patient ID: Ruth Cole, female    DOB: 07/25/1976, 41 y.o.   MRN: [id number]  Chief Complaint  Patient presents with  . Annual Exam    HPI  Patient is in today for an annual examination. Patient has a Hx of insomnia, dyspepsia, IBS. Patient has no acute concerns noted at this moment. She has decreased her Lexapro to 10 mg daiy and added Wellbutrin XL 150 mg Cole daily in past week and already she does feel a little more energetic. No concerning side effects noted. No anhedonia or suicidal ideation. She has not had any flare in her GI symptoms but with the dropping of the SSRI she is afraid they will return. Does not endorse CP/palp/SOB/HA/fevers/GI or GU c/o. Taking meds as prescribed  Patient Care Team: Mosie Lukes, MD as PCP - General (Family Medicine)   Past Medical History:  Diagnosis Date  . Acute upper respiratory infection 11/22/2013  . Allergy    seasonal  . Chicken pox as a child  . Depression with anxiety 12/23/2015  . H/O sinusitis    improved s/p surgical repair of deviated septum  . History of chicken pox   . IBS (irritable bowel syndrome) 02/02/2012  . Insomnia 02/02/2012  . Miscarriage within last 12 months 12/12/2013  . Onychomycosis 10/02/2013  . Overweight 02/02/2017  . PONV (postoperative nausea and vomiting)   . Postpartum care following vaginal delivery (11/15) 10/01/2014  . Preventative health care 02/02/2012  . Urinary incontinence    s/p 3rd vaginal delivery, occasional  . Vaginal delivery    X 3    Past Surgical History:  Procedure Laterality Date  . CLEFT PALATE REPAIR  as a child  . deviated septum repair  teenager  . DILATION AND EVACUATION N/A 02/24/2013   Procedure: DILATATION AND EVACUATION;  Surgeon: Princess Bruins, MD;  Location: [facility];  Service: Gynecology;  Laterality: N/A;  .  DILATION AND EVACUATION N/A 08/24/2013   Procedure: DILATATION AND EVACUATION;  Surgeon: Princess Bruins, MD;  Location: [facility];  Service: Gynecology;  Laterality: N/A;  . MANDIBLE FRACTURE SURGERY  2008  . skin biopsy     multiple all benign    Family History  Problem Relation Age of Onset  . Hypertension Mother   . Hyperlipidemia Mother   . Diabetes Father     type 2  . Kidney disease Father   . Alcohol abuse Father   . Stroke Maternal Grandmother     few  . Cancer Maternal Grandfather     stomach  . Heart attack Maternal Grandfather     few  . Cancer Paternal Grandfather     lung- nonsmoker    Social History   Social History  . Marital status: Married    Spouse name: N/A  . Number of children: N/A  . Years of education: N/A   Occupational History  . Not on file.   Social History Main Topics  . Smoking status: Never Smoker  . Smokeless tobacco: Never Used  . Alcohol use No  . Drug use: No  . Sexual activity: Yes    Partners: Male     Comment: approx [redacted] wks gestation   Other Topics Concern  . Not on file   Social History Narrative  . No narrative on file  Outpatient Medications Prior to Visit  Medication Sig Dispense Refill  . Multiple Vitamins-Minerals (WOMENS MULTIVITAMIN) TABS Take by mouth daily.    . Probiotic Product (PROBIOTIC DAILY Cole) Take by mouth.    Marland Kitchen buPROPion (WELLBUTRIN XL) 150 MG 24 hr tablet Take 1 tablet (150 mg total) by mouth daily. Take for 7 days. 7 tablet 0  . escitalopram (LEXAPRO) 10 MG tablet TAKE 1 TABLET (10 MG TOTAL) BY MOUTH 2 TIMES DAILY. (Patient taking differently: TAKE 1 TABLET (10 MG TOTAL) BY MOUTH DAILY.) 180 tablet 1  . fluticasone (FLONASE) 50 MCG/ACT nasal spray Place 2 sprays into both nostrils daily.    Marland Kitchen azithromycin (ZITHROMAX) 250 MG tablet Take 1 tablet (250 mg total) by mouth daily. Take first 2 tablets together, then 1 every day until finished. (Patient not taking: Reported on 02/02/2017) 6 tablet 0  .  DM-Doxylamine-Acetaminophen (NYQUIL COLD & FLU Cole) Take by mouth at bedtime as needed.    Marland Kitchen HYDROcodone-homatropine (HYCODAN) 5-1.5 MG/5ML syrup Take 5 mLs by mouth every 8 (eight) hours as needed for cough. (Patient not taking: Reported on 02/02/2017) 120 mL 0  . HYDROcodone-homatropine (HYCODAN) 5-1.5 MG/5ML syrup Take 5 mLs by mouth every 6 (six) hours as needed for cough. (Patient not taking: Reported on 02/02/2017) 80 mL 0  . levofloxacin (LEVAQUIN) 500 MG tablet Take 1 tablet (500 mg total) by mouth daily. (Patient not taking: Reported on 02/02/2017) 7 tablet 0  . loratadine (CLARITIN) 10 MG tablet Take 10 mg by mouth daily.     No facility-administered medications prior to visit.     No Known Allergies  Review of Systems  Constitutional: Positive for malaise/fatigue. Negative for fever.  HENT: Negative for congestion.   Eyes: Negative for blurred vision.  Respiratory: Negative for cough and shortness of breath.   Cardiovascular: Negative for chest pain, palpitations and leg swelling.  Gastrointestinal: Negative for vomiting.  Musculoskeletal: Negative for back pain.  Skin: Negative for rash.  Neurological: Negative for loss of consciousness and headaches.  Psychiatric/Behavioral: Negative for depression. The patient is nervous/anxious.        Objective:    Physical Exam  Constitutional: She is oriented to person, place, and time. She appears well-developed and well-nourished. No distress.  HENT:  Head: Normocephalic and atraumatic.  Eyes: Conjunctivae are normal.  Neck: Normal range of motion. No thyromegaly present.  Cardiovascular: Normal rate and regular rhythm.   Pulmonary/Chest: Effort normal and breath sounds normal. She has no wheezes.  Abdominal: Soft. Bowel sounds are normal. There is no tenderness.  Musculoskeletal: She exhibits no edema or deformity.  Neurological: She is alert and oriented to person, place, and time.  Skin: Skin is warm and dry. She is not  diaphoretic.  Psychiatric: She has a normal mood and affect.    BP 113/77 (BP Location: Left Arm, Patient Position: Sitting, Cuff Size: Normal)   Pulse 75   Temp 98.6 F (37 C) (Oral)   Ht 5' 4" (1.626 m)   Wt 154 lb 3.2 oz (69.9 kg)   SpO2 100% Comment: RA  BMI 26.47 kg/m  Wt Readings from Last 3 Encounters:  02/02/17 154 lb 3.2 oz (69.9 kg)  11/15/16 150 lb (68 kg)  11/05/16 153 lb 6 oz (69.6 kg)   BP Readings from Last 3 Encounters:  02/02/17 113/77  11/15/16 114/78  11/05/16 98/62     Immunization History  Administered Date(s) Administered  . Influenza Whole 08/18/2011  . Influenza-Unspecified 08/17/2013, 08/17/2014, 08/18/2015  .  Tdap 11/17/2008    Health Maintenance  Topic Date Due  . TETANUS/TDAP  11/17/2018  . PAP SMEAR  01/16/2019  . INFLUENZA VACCINE  Completed  . HIV Screening  Completed    Lab Results  Component Value Date   WBC 8.2 12/19/2015   HGB 13.7 12/19/2015   HCT 40.8 12/19/2015   PLT 307.0 12/19/2015   GLUCOSE 88 11/15/2016   CHOL 99 12/19/2015   TRIG 184.0 (H) 12/19/2015   HDL 34.50 (L) 12/19/2015   LDLCALC 28 12/19/2015   ALT 12 11/15/2016   AST 15 11/15/2016   NA 138 11/15/2016   K 3.9 11/15/2016   CL 103 11/15/2016   CREATININE 0.56 11/15/2016   BUN 10 11/15/2016   CO2 29 11/15/2016   TSH 1.02 12/19/2015    Lab Results  Component Value Date   TSH 1.02 12/19/2015   Lab Results  Component Value Date   WBC 8.2 12/19/2015   HGB 13.7 12/19/2015   HCT 40.8 12/19/2015   MCV 91.7 12/19/2015   PLT 307.0 12/19/2015   Lab Results  Component Value Date   NA 138 11/15/2016   K 3.9 11/15/2016   CO2 29 11/15/2016   GLUCOSE 88 11/15/2016   BUN 10 11/15/2016   CREATININE 0.56 11/15/2016   BILITOT 0.4 11/15/2016   ALKPHOS 44 11/15/2016   AST 15 11/15/2016   ALT 12 11/15/2016   PROT 6.9 11/15/2016   ALBUMIN 4.3 11/15/2016   CALCIUM 9.3 11/15/2016   GFR 125.37 12/19/2015   Lab Results  Component Value Date   CHOL 99  12/19/2015   Lab Results  Component Value Date   HDL 34.50 (L) 12/19/2015   Lab Results  Component Value Date   LDLCALC 28 12/19/2015   Lab Results  Component Value Date   TRIG 184.0 (H) 12/19/2015   Lab Results  Component Value Date   CHOLHDL 3 12/19/2015   No results found for: HGBA1C       Assessment & Plan:   Problem List Items Addressed This Visit    Preventative health care    Patient encouraged to maintain heart healthy diet, regular exercise, adequate sleep. Consider daily probiotics. Take medications as prescribed. Has advanced directives. Encouraged to bring in a copy. Labs perfomed today      Relevant Orders   CBC   Comprehensive metabolic panel   TSH   IBS (irritable bowel syndrome)    She notes these symptoms were improved on SSRI if these symptoms flare off of med could consider keeping patient on 5 mg of Lexapro daily. Encouraged daily probiotics and fiber      Allergic state    Uses Flonase most. May add Zyrtec up to bid and can consider adding Singulair and Astelin as needed      Depression with anxiety    Felt like the Lexapro caused weight gain and fatigue. Did help mood some. But decided with counselor to try to change meds. Did titrate down the Lexapro from 20 to 10 mg now will drop to 5 mg x 3 days then stop. Will increase the Wellbutrin XL to 300 mg daily.       Overweight    Met with nutritionist and has set up to start work outs with a Physiological scientist once a week but is frustrated with weight gain on SSRI, will try stopping med and patient instructed to look at [facility] or [facility] diet for guidance       Other Visit Diagnoses  Anxiety and depression    -  Primary   Mixed hyperlipidemia       Relevant Orders   Lipid panel      I have discontinued Ms. Shindler's escitalopram, fluticasone, loratadine, DM-Doxylamine-Acetaminophen (NYQUIL COLD & FLU Cole), levofloxacin, HYDROcodone-homatropine, azithromycin, HYDROcodone-homatropine, and  buPROPion. I am also having her start on buPROPion, fluticasone, and cetirizine. Additionally, I am having her maintain her Probiotic Product (PROBIOTIC DAILY Cole) and WOMENS MULTIVITAMIN.  Meds ordered this encounter  Medications  . buPROPion (WELLBUTRIN XL) 300 MG 24 hr tablet    Sig: Take 1 tablet (300 mg total) by mouth daily.    Dispense:  30 tablet    Refill:  4  . fluticasone (FLONASE) 50 MCG/ACT nasal spray    Sig: Place 2 sprays into both nostrils daily as needed for allergies or rhinitis.    Dispense:  16 g    Refill:  6  . cetirizine (ZYRTEC) 10 MG tablet    Sig: Take 1 tablet (10 mg total) by mouth daily as needed for allergies.    Dispense:  30 tablet    Refill:  5    CMA served as scribe during this visit. History, Physical and Plan performed by medical provider. Documentation and orders reviewed and attested to.  [name], MD

## 2017-02-02 NOTE — Assessment & Plan Note (Addendum)
Felt like the Lexapro caused weight gain and fatigue. Did help mood some. But decided with counselor to try to change meds. Did titrate down the Lexapro from 20 to 10 mg now will drop to 5 mg x 3 days then stop. Will increase the Wellbutrin XL to 300 mg daily.

## 2017-02-02 NOTE — Progress Notes (Signed)
Patient ID: Ruth Cole, female   DOB: [birth year], 41 y.o.   MRN: [id number]  CC: Yearly checkup and follow-up on depression with anxiety  HPI: Mrs Ruth Cole is a 41 year old woman with a history of depression with anxiety who presents to clinic for follow-up of her depression and yearly checkup. Her mood has been improved with escitalopram but she has also experienced fatigue, sleepiness, and weight gain, so has started taking bupropion last week with a noticeable improvement in fatigue. She also sees a Social worker every 2-4 weeks and finds it very helpful. She has no acute complaints and her recent sinus infection has completely resolved. Recently her reflux has not bothered her and she has not had any trouble with bowel movements.  PMH: Seasonal allergies Depression with anxiety Recurrent sinusitis s/p deviated septum repair IBS Insomnia Urinary incontinence s/p 3rd vaginal delivery, occasional  SH: Cleft palate repair-childhood Deviated septum repair-teenager Dilation&evacuation-02/24/13 and 08/24/13 Mandible fracture surgery-2008 Skin biopsy-multiple, all benign  Meds: Probiotic oral daily Escitalopram 10mg  oral BID Bupropion (? Dosing)  Allergies: NKDA  FH: See chart  SH: Mrs Ruth Cole lives with her husband and children and works as a Software engineer at a [employer]. She has never smoked and does not drink alcohol.  ROS: Constitutional: No fevers, heat or cold intolerance, changes to hair, skin, or nails. 6lb weight gain since last visit. Psych: No acute mood changes, depression has been well-controlled on escitalopram per HPI. HEENT: No headaches, no visual or auditory changes, no difficulty with speech or swallowing. Pulm: No chest pain, dyspnea, orthopnea, or cough. No wheezing. Cardio: No chest pain or tightness or palpitations. GI: No nausea, vomiting, diarrhea, or constipation. Heme: No bruising or bleeding. MSK: No pain or stiffness. Skin: No rash or skin  changes.  Physical Exam: Vitals: T: 98.6 HR: 75 BP: 113/77 SpO2: 100% Ht: 5'4" Wt 154.2 lb General: Well-appearing, conversational, nontoxic. HEENT: Pupils equal, round, and reactive to light, extraocular movements intact, mucus membranes moist. No cervical lymphadenopathy. Pulm: No discomfort breathing at rest, cyanosis, or clubbing. Trachea is midline. Lungs tympanic to percussion, clear to auscultation bilaterally, no wheezes, rales, or rhonchi. Cardio: Patient looks well perfused; no pallor; radial, carotid, and dorsalis pedis pulses 2+. Regular rate&rhythm, normal S1 S2 without murmurs, rubs, or gallops. No carotid bruits or JVD. Abdominal: Normoactive bowel sounds, abdomen is soft and nontender to palpation and nondistended. Neuro: Cranial nerves II-XII grossly intact. Patellar tendon reflexes normal. Skin: No rashes, redness, or warmth. Psych: Normal affect.  Problem list:  1 Depression with anxiety 2 Screening and prevention 3 Seasonal allergies  Assessment:  Mrs Ruth Cole is a 41 year old woman with a history of depression with anxiety presenting for follow-up and yearly checkup. Her depression is well controlled on escitalopram and she is currently switching to bupropion because of side effects of fatigue, sleepiness, and weight gain. We also discussed healthy diet and exercise and she has signed up for a personal trainer once a week for the next ten weeks.  Plan: 1 Discontinue escitalopram and increase bupropion and continue to monitor mood and for side effects. Discussed possibility of reflux worsening when discontinuing escitalopram and options if this occurs including lifestyle changes and antacid/antihistamines. 2 Pap smear was performed last year and is due again next year. Up to date on vaccinations. 3 Discussed over-the-counter antihistamines.

## 2017-02-02 NOTE — Assessment & Plan Note (Signed)
She notes these symptoms were improved on SSRI if these symptoms flare off of med could consider keeping patient on 5 mg of Lexapro daily. Encouraged daily probiotics and fiber

## 2017-02-02 NOTE — Progress Notes (Signed)
Pre visit review using our clinic review tool, if applicable. No additional management support is needed unless otherwise documented below in the visit note. 

## 2017-02-07 ENCOUNTER — Encounter: Payer: Self-pay | Admitting: Family Medicine

## 2017-02-10 MED ORDER — ESCITALOPRAM OXALATE 5 MG PO TABS: 5.0000 mg | ORAL_TABLET | Freq: Every day | ORAL | 3 refills | Status: DC

## 2017-02-10 MED ORDER — BUPROPION HCL ER (SR) 150 MG PO TB12: 150.0000 mg | ORAL_TABLET | Freq: Two times a day (BID) | ORAL | 3 refills | Status: DC

## 2017-02-23 ENCOUNTER — Ambulatory Visit (INDEPENDENT_AMBULATORY_CARE_PROVIDER_SITE_OTHER): Payer: 59 | Admitting: Psychology

## 2017-02-23 DIAGNOSIS — F331 Major depressive disorder, recurrent, moderate: Secondary | ICD-10-CM

## 2017-03-04 ENCOUNTER — Ambulatory Visit: Payer: 59 | Admitting: Psychology

## 2017-03-16 ENCOUNTER — Ambulatory Visit: Payer: 59 | Admitting: Psychology

## 2017-03-23 ENCOUNTER — Other Ambulatory Visit: Payer: Self-pay | Admitting: Family Medicine

## 2017-03-23 ENCOUNTER — Encounter: Payer: Self-pay | Admitting: Family Medicine

## 2017-03-23 MED ORDER — ESCITALOPRAM OXALATE 10 MG PO TABS: 10.0000 mg | ORAL_TABLET | Freq: Two times a day (BID) | ORAL | 3 refills | Status: DC

## 2017-03-24 MED FILL — ESCITALOPRAM 10 MG TABLET: 10 | 90 days supply | Qty: 180 | Fill #1

## 2017-03-30 ENCOUNTER — Ambulatory Visit (INDEPENDENT_AMBULATORY_CARE_PROVIDER_SITE_OTHER): Payer: 59 | Admitting: Psychology

## 2017-03-30 DIAGNOSIS — F341 Dysthymic disorder: Secondary | ICD-10-CM | POA: Diagnosis not present

## 2017-04-27 ENCOUNTER — Ambulatory Visit: Payer: 59 | Admitting: Psychology

## 2017-05-11 ENCOUNTER — Ambulatory Visit: Payer: Self-pay | Admitting: Family Medicine

## 2017-05-11 ENCOUNTER — Ambulatory Visit: Payer: 59 | Admitting: Psychology

## 2017-05-15 ENCOUNTER — Ambulatory Visit (INDEPENDENT_AMBULATORY_CARE_PROVIDER_SITE_OTHER): Payer: 59 | Admitting: Family Medicine

## 2017-05-15 ENCOUNTER — Encounter: Payer: Self-pay | Admitting: Family Medicine

## 2017-05-15 DIAGNOSIS — F418 Other specified anxiety disorders: Secondary | ICD-10-CM | POA: Diagnosis not present

## 2017-05-15 MED ORDER — FLUOXETINE HCL 10 MG PO TABS: 10.0000 mg | ORAL_TABLET | Freq: Two times a day (BID) | ORAL | 3 refills | Status: DC

## 2017-05-15 MED FILL — FLUoxetine HCL 10 MG TABS: 10 | 30 days supply | Qty: 60 | Fill #0

## 2017-05-15 NOTE — Patient Instructions (Addendum)
Send me a MyChart message for practitioners name when you are able.  Major Depressive Disorder, Adult Major depressive disorder (MDD) is a mental health condition. MDD often makes you feel sad, hopeless, or helpless. MDD can also cause symptoms in your body. MDD can affect your:  Work.  School.  Relationships.  Other normal activities.  MDD can range from mild to very bad. It may occur once (single episode MDD). It can also occur many times (recurrent MDD). The main symptoms of MDD often include:  Feeling sad, depressed, or irritable most of the time.  Loss of interest.  MDD symptoms also include:  Sleeping too much or too little.  Eating too much or too little.  A change in your weight.  Feeling tired (fatigue) or having low energy.  Feeling worthless.  Feeling guilty.  Trouble making decisions.  Trouble thinking clearly.  Thoughts of suicide or harming others.  Feeling weak.  Feeling agitated.  Keeping yourself from being around other people (isolation).  Follow these instructions at home: Activity  Do these things as told by your doctor: ? Go back to your normal activities. ? Exercise regularly. ? Spend time outdoors. Alcohol  Talk with your doctor about how alcohol can affect your antidepressant medicines.  Do not drink alcohol. Or, limit how much alcohol you drink. ? This means no more than 1 drink a day for nonpregnant women and 2 drinks a day for men. One drink equals one of these:  12 oz of beer.  5 oz of wine.  1 oz of hard liquor. General instructions  Take over-the-counter and prescription medicines only as told by your doctor.  Eat a healthy diet.  Get plenty of sleep.  Find activities that you enjoy. Make time to do them.  Think about joining a support group. Your doctor may be able to suggest a group for you.  Keep all follow-up visits as told by your doctor. This is important. Where to find more information:  [employer] Mental Illness: ? www.nami.[place]: ? https://carter.com/  National Suicide Prevention Lifeline: ? (512)698-6816. This is free, 24-hour help. Contact a doctor if:  Your symptoms get worse.  You have new symptoms. Get help right away if:  You self-harm.  You see, hear, taste, smell, or feel things that are not present (hallucinate). If you ever feel like you may hurt yourself or others, or have thoughts about taking your own life, get help right away. You can go to your nearest emergency department or call:  Your local emergency services (911 in the [place]  A suicide crisis helpline, such as the National Suicide Prevention Lifeline: ? 4691540660. This is open 24 hours a day.  This information is not intended to replace advice given to you by your health care provider. Make sure you discuss any questions you have with your health care provider. Document Released: 10/15/2015 Document Revised: 07/20/2016 Document Reviewed: 07/20/2016 Elsevier Interactive Patient Education  2017 [employer].

## 2017-05-15 NOTE — Progress Notes (Signed)
Subjective:  I acted as a Education administrator for Dr. Charlett Blake. Ruth Cole, [place]  Patient ID: Ruth Cole, female    DOB: Aug 29, 1976, 41 y.o.   MRN: [id number]  Chief Complaint  Patient presents with  . Follow-up    HPI  Patient is in today for a follow up. Patient states she feels like her medication she takes for depression works some times not all the time. She scored a 14 on the PHQ-9 test. She is visually shaken up a bit. No recent febrile illness or acute hospitalizations. Denies CP/palp/SOB/HA/congestion/fevers/GI or GU c/o. Taking meds as prescribed. She is still struggling with anhedonia and lack of motivation. No suicidal or homicidal ideation. Notes fatigue.   Patient Care Team: Mosie Lukes, MD as PCP - General (Family Medicine)   Past Medical History:  Diagnosis Date  . Acute upper respiratory infection 11/22/2013  . Allergy    seasonal  . Chicken pox as a child  . Depression with anxiety 12/23/2015  . H/O sinusitis    improved s/p surgical repair of deviated septum  . History of chicken pox   . IBS (irritable bowel syndrome) 02/02/2012  . Insomnia 02/02/2012  . Miscarriage within last 12 months 12/12/2013  . Onychomycosis 10/02/2013  . Overweight 02/02/2017  . PONV (postoperative nausea and vomiting)   . Postpartum care following vaginal delivery (11/15) 10/01/2014  . Preventative health care 02/02/2012  . Urinary incontinence    s/p 3rd vaginal delivery, occasional  . Vaginal delivery    X 3    Past Surgical History:  Procedure Laterality Date  . CLEFT PALATE REPAIR  as a child  . deviated septum repair  teenager  . DILATION AND EVACUATION N/A 02/24/2013   Procedure: DILATATION AND EVACUATION;  Surgeon: Sundra Haddix Bruins, MD;  Location: [facility];  Service: Gynecology;  Laterality: N/A;  . DILATION AND EVACUATION N/A 08/24/2013   Procedure: DILATATION AND EVACUATION;  Surgeon: Nur Krasinski Bruins, MD;  Location: [facility];  Service: Gynecology;  Laterality: N/A;  . MANDIBLE  FRACTURE SURGERY  2008  . skin biopsy     multiple all benign    Family History  Problem Relation Age of Onset  . Hypertension Mother   . Hyperlipidemia Mother   . Diabetes Father        type 2  . Kidney disease Father   . Alcohol abuse Father   . Stroke Maternal Grandmother        few  . Cancer Maternal Grandfather        stomach  . Heart attack Maternal Grandfather        few  . Cancer Paternal Grandfather        lung- nonsmoker    Social History   Social History  . Marital status: Married    Spouse name: N/A  . Number of children: N/A  . Years of education: N/A   Occupational History  . Not on file.   Social History Main Topics  . Smoking status: Never Smoker  . Smokeless tobacco: Never Used  . Alcohol use No  . Drug use: No  . Sexual activity: Yes    Partners: Male     Comment: approx [redacted] wks gestation   Other Topics Concern  . Not on file   Social History Narrative  . No narrative on file    Outpatient Medications Prior to Visit  Medication Sig Dispense Refill  . cetirizine (ZYRTEC) 10 MG tablet Take 1 tablet (10 mg total) by mouth  daily as needed for allergies. 30 tablet 5  . escitalopram (LEXAPRO) 10 MG tablet Take 1 tablet (10 mg total) by mouth 2 (two) times daily. 60 tablet 3  . fluticasone (FLONASE) 50 MCG/ACT nasal spray Place 2 sprays into both nostrils daily as needed for allergies or rhinitis. 16 g 6  . Multiple Vitamins-Minerals (WOMENS MULTIVITAMIN) TABS Take by mouth daily.    . Probiotic Product (PROBIOTIC DAILY PO) Take by mouth.     No facility-administered medications prior to visit.     No Known Allergies  Review of Systems  Constitutional: Negative for fever and malaise/fatigue.  HENT: Negative for congestion.   Eyes: Negative for blurred vision.  Respiratory: Negative for cough and shortness of breath.   Cardiovascular: Negative for chest pain, palpitations and leg swelling.  Gastrointestinal: Negative for vomiting.    Musculoskeletal: Negative for back pain.  Skin: Negative for rash.  Neurological: Negative for loss of consciousness and headaches.       Objective:    Physical Exam  Constitutional: She is oriented to person, place, and time. She appears well-developed and well-nourished. No distress.  HENT:  Head: Normocephalic and atraumatic.  Eyes: Conjunctivae are normal.  Neck: Normal range of motion. No thyromegaly present.  Cardiovascular: Normal rate and regular rhythm.   Pulmonary/Chest: Effort normal and breath sounds normal. She has no wheezes.  Abdominal: Soft. Bowel sounds are normal. There is no tenderness.  Musculoskeletal: Normal range of motion. She exhibits no edema or deformity.  Neurological: She is alert and oriented to person, place, and time.  Skin: Skin is warm and dry. She is not diaphoretic.  Psychiatric: She has a normal mood and affect.    BP 118/72 (BP Location: Left Arm, Patient Position: Sitting, Cuff Size: Normal)   Pulse 97   Temp 97.9 F (36.6 C) (Oral)   Resp 18   Ht 5\' 4"  (1.626 m)   Wt 153 lb 6.4 oz (69.6 kg)   SpO2 98%   BMI 26.33 kg/m  Wt Readings from Last 3 Encounters:  05/15/17 153 lb 6.4 oz (69.6 kg)  02/02/17 154 lb 3.2 oz (69.9 kg)  11/15/16 150 lb (68 kg)   BP Readings from Last 3 Encounters:  05/15/17 118/72  02/02/17 113/77  11/15/16 114/78     Immunization History  Administered Date(s) Administered  . Influenza Whole 08/18/2011  . Influenza-Unspecified 08/17/2013, 08/17/2014, 08/18/2015  . Tdap 11/17/2008    Health Maintenance  Topic Date Due  . INFLUENZA VACCINE  06/17/2017  . TETANUS/TDAP  11/17/2018  . PAP SMEAR  01/16/2019  . HIV Screening  Completed    Lab Results  Component Value Date   WBC 9.5 02/02/2017   HGB 14.1 02/02/2017   HCT 42.1 02/02/2017   PLT 345.0 02/02/2017   GLUCOSE 95 02/02/2017   CHOL 136 02/02/2017   TRIG 53.0 02/02/2017   HDL 44.90 02/02/2017   LDLCALC 80 02/02/2017   ALT 14 02/02/2017    AST 20 02/02/2017   NA 138 02/02/2017   K 4.0 02/02/2017   CL 103 02/02/2017   CREATININE 0.59 02/02/2017   BUN 13 02/02/2017   CO2 28 02/02/2017   TSH 0.58 02/02/2017    Lab Results  Component Value Date   TSH 0.58 02/02/2017   Lab Results  Component Value Date   WBC 9.5 02/02/2017   HGB 14.1 02/02/2017   HCT 42.1 02/02/2017   MCV 93.1 02/02/2017   PLT 345.0 02/02/2017   Lab Results  Component Value Date   NA 138 02/02/2017   K 4.0 02/02/2017   CO2 28 02/02/2017   GLUCOSE 95 02/02/2017   BUN 13 02/02/2017   CREATININE 0.59 02/02/2017   BILITOT 0.6 02/02/2017   ALKPHOS 48 02/02/2017   AST 20 02/02/2017   ALT 14 02/02/2017   PROT 7.0 02/02/2017   ALBUMIN 4.3 02/02/2017   CALCIUM 9.1 02/02/2017   GFR 119.79 02/02/2017   Lab Results  Component Value Date   CHOL 136 02/02/2017   Lab Results  Component Value Date   HDL 44.90 02/02/2017   Lab Results  Component Value Date   LDLCALC 80 02/02/2017   Lab Results  Component Value Date   TRIG 53.0 02/02/2017   Lab Results  Component Value Date   CHOLHDL 3 02/02/2017   No results found for: HGBA1C       Assessment & Plan:   Problem List Items Addressed This Visit    None      I am having Ms. Keng maintain her Probiotic Product (PROBIOTIC DAILY PO), WOMENS MULTIVITAMIN, fluticasone, cetirizine, escitalopram, and buPROPion.  Meds ordered this encounter  Medications  . buPROPion (WELLBUTRIN SR) 150 MG 12 hr tablet    Sig: Take 1 tablet by mouth 2 (two) times daily.    Refill:  3    CMA served as Education administrator during this visit. History, Physical and Plan performed by medical provider. Documentation and orders reviewed and attested to.  Magdalene Molly, [place]

## 2017-05-15 NOTE — Assessment & Plan Note (Addendum)
Continues to struggle, Lexapro helped her anxiety but she was frustrated with weight gain. Wellbutrin did not agree with her made her feel more anxious. She has dropped the Wellbutrin 150 mg po q am and lexapro 5 mg qhs. She is maintaining but does not feel great and she has more depressive thoughts on the lower dose of meds. Is following [facility]. She agrees to referral to psychiatry for futher consideration. Counseled for 25 minutes of 30 minute visit

## 2017-05-23 ENCOUNTER — Telehealth: Payer: 59 | Admitting: Family

## 2017-05-23 DIAGNOSIS — B9689 Other specified bacterial agents as the cause of diseases classified elsewhere: Secondary | ICD-10-CM | POA: Diagnosis not present

## 2017-05-23 DIAGNOSIS — J329 Chronic sinusitis, unspecified: Secondary | ICD-10-CM | POA: Diagnosis not present

## 2017-05-23 MED ORDER — AMOXICILLIN-POT CLAVULANATE 875-125 MG PO TABS: 1.0000 | ORAL_TABLET | Freq: Two times a day (BID) | ORAL | 0 refills | Status: AC

## 2017-05-23 NOTE — Progress Notes (Signed)
Thank you for the details you put in the comment boxes. Those details really help us take better care of you.   We are sorry that you are not feeling well.  Here is how we plan to help!  Based on what you have shared with me it looks like you have sinusitis.  Sinusitis is inflammation and infection in the sinus cavities of the head.  Based on your presentation I believe you most likely have Acute Bacterial Sinusitis.  This is an infection caused by bacteria and is treated with antibiotics. I have prescribed Augmentin 875mg/125mg one tablet twice daily with food, for 7 days. You may use an oral decongestant such as Mucinex D or if you have glaucoma or high blood pressure use plain Mucinex. Saline nasal spray help and can safely be used as often as needed for congestion.  If you develop worsening sinus pain, fever or notice severe headache and vision changes, or if symptoms are not better after completion of antibiotic, please schedule an appointment with a health care provider.    Sinus infections are not as easily transmitted as other respiratory infection, however we still recommend that you avoid close contact with loved ones, especially the very young and elderly.  Remember to wash your hands thoroughly throughout the day as this is the number one way to prevent the spread of infection!  Home Care:  Only take medications as instructed by your medical team.  Complete the entire course of an antibiotic.  Do not take these medications with alcohol.  A steam or ultrasonic humidifier can help congestion.  You can place a towel over your head and breathe in the steam from hot water coming from a faucet.  Avoid close contacts especially the very young and the elderly.  Cover your mouth when you cough or sneeze.  Always remember to wash your hands.  Get Help Right Away If:  You develop worsening fever or sinus pain.  You develop a severe head ache or visual changes.  Your symptoms persist  after you have completed your treatment plan.  Make sure you  Understand these instructions.  Will watch your condition.  Will get help right away if you are not doing well or get worse.  Your e-visit answers were reviewed by a board certified advanced clinical practitioner to complete your personal care plan.  Depending on the condition, your plan could have included both over the counter or prescription medications.  If there is a problem please reply  once you have received a response from your provider.  Your safety is important to us.  If you have drug allergies check your prescription carefully.    You can use MyChart to ask questions about today's visit, request a non-urgent call back, or ask for a work or school excuse for 24 hours related to this e-Visit. If it has been greater than 24 hours you will need to follow up with your provider, or enter a new e-Visit to address those concerns.  You will get an e-mail in the next two days asking about your experience.  I hope that your e-visit has been valuable and will speed your recovery. Thank you for using e-visits.    

## 2017-06-22 ENCOUNTER — Ambulatory Visit (INDEPENDENT_AMBULATORY_CARE_PROVIDER_SITE_OTHER): Payer: 59 | Admitting: Family Medicine

## 2017-06-22 ENCOUNTER — Encounter: Payer: Self-pay | Admitting: Family Medicine

## 2017-06-22 ENCOUNTER — Ambulatory Visit (INDEPENDENT_AMBULATORY_CARE_PROVIDER_SITE_OTHER): Payer: 59 | Admitting: Psychology

## 2017-06-22 DIAGNOSIS — F341 Dysthymic disorder: Secondary | ICD-10-CM | POA: Diagnosis not present

## 2017-06-22 DIAGNOSIS — F418 Other specified anxiety disorders: Secondary | ICD-10-CM

## 2017-06-22 DIAGNOSIS — G4700 Insomnia, unspecified: Secondary | ICD-10-CM

## 2017-06-22 MED ORDER — BUPROPION HCL ER (SR) 150 MG PO TB12: 150.0000 mg | ORAL_TABLET | Freq: Two times a day (BID) | ORAL | 3 refills | Status: DC

## 2017-06-22 MED FILL — BUPROPION SR 150 MG TABLET: 150 | 30 days supply | Qty: 60 | Fill #0

## 2017-06-22 MED FILL — FLUoxetine HCL 10 MG TABS: 10 | 30 days supply | Qty: 60 | Fill #1

## 2017-06-22 NOTE — Assessment & Plan Note (Addendum)
Only taking Prozac 10 mg daily and the Wellbutrin XL 150 mg is still highly anxious but feels more alive. On vacation she felt better. No worsening symptoms. Counseled for 15 minutes of a 20 minute appt

## 2017-06-22 NOTE — Progress Notes (Signed)
Subjective:  I acted as a Education administrator for Dr. Charlett Blake. [place], [place]   Patient ID: Ruth Cole, female    DOB: May 26, 1976, 41 y.o.   MRN: [id number]  No chief complaint on file.   HPI  Patient is in today for a 4 week follow up. She is following up on her depression and anxiety. She states she has been doing well on the Wellbutrin and Prozac. She has no acute concerns today. No recent febrile illness or acute hospitalizations. Denies CP/palp/SOB/HA/congestion/fevers/GI or GU c/o. Taking meds as prescribed. Notes some insomnia but only takes OTC sleep meds 2-3 x a week with good results.   Patient Care Team: Mosie Lukes, MD as PCP - General (Family Medicine)   Past Medical History:  Diagnosis Date  . Acute upper respiratory infection 11/22/2013  . Allergy    seasonal  . Chicken pox as a child  . Depression with anxiety 12/23/2015  . H/O sinusitis    improved s/p surgical repair of deviated septum  . History of chicken pox   . IBS (irritable bowel syndrome) 02/02/2012  . Insomnia 02/02/2012  . Miscarriage within last 12 months 12/12/2013  . Onychomycosis 10/02/2013  . Overweight 02/02/2017  . PONV (postoperative nausea and vomiting)   . Postpartum care following vaginal delivery (11/15) 10/01/2014  . Preventative health care 02/02/2012  . Urinary incontinence    s/p 3rd vaginal delivery, occasional  . Vaginal delivery    X 3    Past Surgical History:  Procedure Laterality Date  . CLEFT PALATE REPAIR  as a child  . deviated septum repair  teenager  . DILATION AND EVACUATION N/A 02/24/2013   Procedure: DILATATION AND EVACUATION;  Surgeon: [place] Bruins, MD;  Location: [facility];  Service: Gynecology;  Laterality: N/A;  . DILATION AND EVACUATION N/A 08/24/2013   Procedure: DILATATION AND EVACUATION;  Surgeon: [place] Bruins, MD;  Location: [facility];  Service: Gynecology;  Laterality: N/A;  . MANDIBLE FRACTURE SURGERY  2008  . skin biopsy     multiple all benign    Family  History  Problem Relation Age of Onset  . Hypertension Mother   . Hyperlipidemia Mother   . Diabetes Father        type 2  . Kidney disease Father   . Alcohol abuse Father   . Stroke Maternal Grandmother        few  . Cancer Maternal Grandfather        stomach  . Heart attack Maternal Grandfather        few  . Cancer Paternal Grandfather        lung- nonsmoker    Social History   Social History  . Marital status: Married    Spouse name: N/A  . Number of children: N/A  . Years of education: N/A   Occupational History  . Not on file.   Social History Main Topics  . Smoking status: Never Smoker  . Smokeless tobacco: Never Used  . Alcohol use No  . Drug use: No  . Sexual activity: Yes    Partners: Male     Comment: approx [redacted] wks gestation   Other Topics Concern  . Not on file   Social History Narrative  . No narrative on file    Outpatient Medications Prior to Visit  Medication Sig Dispense Refill  . cetirizine (ZYRTEC) 10 MG tablet Take 1 tablet (10 mg total) by mouth daily as needed for allergies. 30 tablet 5  .  FLUoxetine (PROZAC) 10 MG tablet Take 1 tablet (10 mg total) by mouth 2 (two) times daily. 60 tablet 3  . fluticasone (FLONASE) 50 MCG/ACT nasal spray Place 2 sprays into both nostrils daily as needed for allergies or rhinitis. 16 g 6  . Multiple Vitamins-Minerals (WOMENS MULTIVITAMIN) TABS Take by mouth daily.    . Probiotic Product (PROBIOTIC DAILY PO) Take by mouth.    Marland Kitchen buPROPion (WELLBUTRIN SR) 150 MG 12 hr tablet Take 1 tablet by mouth 2 (two) times daily.  3  . escitalopram (LEXAPRO) 10 MG tablet Take 1 tablet (10 mg total) by mouth 2 (two) times daily. 60 tablet 3   No facility-administered medications prior to visit.     No Known Allergies  Review of Systems  Constitutional: Negative for fever and malaise/fatigue.  HENT: Negative for congestion.   Eyes: Negative for blurred vision.  Respiratory: Negative for cough and shortness of breath.    Cardiovascular: Negative for chest pain, palpitations and leg swelling.  Gastrointestinal: Negative for vomiting.  Musculoskeletal: Negative for back pain.  Skin: Negative for rash.  Neurological: Negative for loss of consciousness and headaches.  Psychiatric/Behavioral: The patient is nervous/anxious and has insomnia.        Objective:    Physical Exam  Constitutional: She is oriented to person, place, and time. She appears well-developed and well-nourished. No distress.  HENT:  Head: Normocephalic and atraumatic.  Eyes: Conjunctivae are normal.  Neck: Normal range of motion. No thyromegaly present.  Cardiovascular: Normal rate and regular rhythm.   Pulmonary/Chest: Effort normal and breath sounds normal. She has no wheezes.  Abdominal: Soft. Bowel sounds are normal. There is no tenderness.  Musculoskeletal: Normal range of motion. She exhibits no edema or deformity.  Neurological: She is alert and oriented to person, place, and time.  Skin: Skin is warm and dry. She is not diaphoretic.  Psychiatric: She has a normal mood and affect.    BP 118/70 (BP Location: Left Arm, Patient Position: Sitting, Cuff Size: Normal)   Pulse 91   Temp 98 F (36.7 C) (Oral)   Resp 18   Wt 156 lb 6.4 oz (70.9 kg)   SpO2 98%   BMI 26.85 kg/m  Wt Readings from Last 3 Encounters:  06/22/17 156 lb 6.4 oz (70.9 kg)  05/15/17 153 lb 6.4 oz (69.6 kg)  02/02/17 154 lb 3.2 oz (69.9 kg)   BP Readings from Last 3 Encounters:  06/22/17 118/70  05/15/17 118/72  02/02/17 113/77     Immunization History  Administered Date(s) Administered  . Influenza Whole 08/18/2011  . Influenza-Unspecified 08/17/2013, 08/17/2014, 08/18/2015  . Tdap 11/17/2008    Health Maintenance  Topic Date Due  . INFLUENZA VACCINE  06/17/2017  . TETANUS/TDAP  11/17/2018  . PAP SMEAR  01/16/2019  . HIV Screening  Completed    Lab Results  Component Value Date   WBC 9.5 02/02/2017   HGB 14.1 02/02/2017   HCT 42.1  02/02/2017   PLT 345.0 02/02/2017   GLUCOSE 95 02/02/2017   CHOL 136 02/02/2017   TRIG 53.0 02/02/2017   HDL 44.90 02/02/2017   LDLCALC 80 02/02/2017   ALT 14 02/02/2017   AST 20 02/02/2017   NA 138 02/02/2017   K 4.0 02/02/2017   CL 103 02/02/2017   CREATININE 0.59 02/02/2017   BUN 13 02/02/2017   CO2 28 02/02/2017   TSH 0.58 02/02/2017    Lab Results  Component Value Date   TSH 0.58 02/02/2017  Lab Results  Component Value Date   WBC 9.5 02/02/2017   HGB 14.1 02/02/2017   HCT 42.1 02/02/2017   MCV 93.1 02/02/2017   PLT 345.0 02/02/2017   Lab Results  Component Value Date   NA 138 02/02/2017   K 4.0 02/02/2017   CO2 28 02/02/2017   GLUCOSE 95 02/02/2017   BUN 13 02/02/2017   CREATININE 0.59 02/02/2017   BILITOT 0.6 02/02/2017   ALKPHOS 48 02/02/2017   AST 20 02/02/2017   ALT 14 02/02/2017   PROT 7.0 02/02/2017   ALBUMIN 4.3 02/02/2017   CALCIUM 9.1 02/02/2017   GFR 119.79 02/02/2017   Lab Results  Component Value Date   CHOL 136 02/02/2017   Lab Results  Component Value Date   HDL 44.90 02/02/2017   Lab Results  Component Value Date   LDLCALC 80 02/02/2017   Lab Results  Component Value Date   TRIG 53.0 02/02/2017   Lab Results  Component Value Date   CHOLHDL 3 02/02/2017   No results found for: HGBA1C       Assessment & Plan:   Problem List Items Addressed This Visit    Insomnia    Encouraged good sleep hygiene such as dark, quiet room. No blue/green glowing lights such as computer screens in bedroom. No alcohol or stimulants in evening. Cut down on caffeine as able. Regular exercise is helpful but not just prior to bed time.       Depression with anxiety    Only taking Prozac 10 mg daily and the Wellbutrin XL 150 mg is still highly anxious but feels more alive. On vacation she felt better. No worsening symptoms. Counseled for 15 minutes of a 20 minute appt         I have discontinued Ms. Wexler's escitalopram. I have also  changed her buPROPion. Additionally, I am having her maintain her Probiotic Product (PROBIOTIC DAILY PO), WOMENS MULTIVITAMIN, fluticasone, cetirizine, and FLUoxetine.  Meds ordered this encounter  Medications  . buPROPion (WELLBUTRIN SR) 150 MG 12 hr tablet    Sig: Take 1 tablet (150 mg total) by mouth 2 (two) times daily.    Dispense:  60 tablet    Refill:  3    CMA served as scribe during this visit. History, Physical and Plan performed by medical provider. Documentation and orders reviewed and attested to.  [name], MD

## 2017-06-22 NOTE — Patient Instructions (Signed)
L tryptophan or Valerian root help insomnia Encouraged good sleep hygiene such as dark, quiet room. No blue/green glowing lights such as computer screens in bedroom. No alcohol or stimulants in evening. Cut down on caffeine as able. Regular exercise is helpful but not just prior to bed time.  Generalized Anxiety Disorder, Adult Generalized anxiety disorder (GAD) is a mental health disorder. People with this condition constantly worry about everyday events. Unlike normal anxiety, worry related to GAD is not triggered by a specific event. These worries also do not fade or get better with time. GAD interferes with life functions, including relationships, work, and school. GAD can vary from mild to severe. People with severe GAD can have intense waves of anxiety with physical symptoms (panic attacks). What are the causes? The exact cause of GAD is not known. What increases the risk? This condition is more likely to develop in:  Women.  People who have a family history of anxiety disorders.  People who are very shy.  People who experience very stressful life events, such as the death of a loved one.  People who have a very stressful family environment.  What are the signs or symptoms? People with GAD often worry excessively about many things in their lives, such as their health and family. They may also be overly concerned about:  Doing well at work.  Being on time.  Natural disasters.  Friendships.  Physical symptoms of GAD include:  Fatigue.  Muscle tension or having muscle twitches.  Trembling or feeling shaky.  Being easily startled.  Feeling like your heart is pounding or racing.  Feeling out of breath or like you cannot take a deep breath.  Having trouble falling asleep or staying asleep.  Sweating.  Nausea, diarrhea, or irritable bowel syndrome (IBS).  Headaches.  Trouble concentrating or remembering facts.  Restlessness.  Irritability.  How is this  diagnosed? Your health care provider can diagnose GAD based on your symptoms and medical history. You will also have a physical exam. The health care provider will ask specific questions about your symptoms, including how severe they are, when they started, and if they come and go. Your health care provider may ask you about your use of alcohol or drugs, including prescription medicines. Your health care provider may refer you to a mental health specialist for further evaluation. Your health care provider will do a thorough examination and may perform additional tests to rule out other possible causes of your symptoms. To be diagnosed with GAD, a person must have anxiety that:  Is out of his or her control.  Affects several different aspects of his or her life, such as work and relationships.  Causes distress that makes him or her unable to take part in normal activities.  Includes at least three physical symptoms of GAD, such as restlessness, fatigue, trouble concentrating, irritability, muscle tension, or sleep problems.  Before your health care provider can confirm a diagnosis of GAD, these symptoms must be present more days than they are not, and they must last for six months or longer. How is this treated? The following therapies are usually used to treat GAD:  Medicine. Antidepressant medicine is usually prescribed for long-term daily control. Antianxiety medicines may be added in severe cases, especially when panic attacks occur.  Talk therapy (psychotherapy). Certain types of talk therapy can be helpful in treating GAD by providing support, education, and guidance. Options include: ? Cognitive behavioral therapy (CBT). People learn coping skills and techniques to ease  their anxiety. They learn to identify unrealistic or negative thoughts and behaviors and to replace them with positive ones. ? Acceptance and commitment therapy (ACT). This treatment teaches people how to be mindful as a  way to cope with unwanted thoughts and feelings. ? Biofeedback. This process trains you to manage your body's response (physiological response) through breathing techniques and relaxation methods. You will work with a therapist while machines are used to monitor your physical symptoms.  Stress management techniques. These include yoga, meditation, and exercise.  A mental health specialist can help determine which treatment is best for you. Some people see improvement with one type of therapy. However, other people require a combination of therapies. Follow these instructions at home:  Take over-the-counter and prescription medicines only as told by your health care provider.  Try to maintain a normal routine.  Try to anticipate stressful situations and allow extra time to manage them.  Practice any stress management or self-calming techniques as taught by your health care provider.  Do not punish yourself for setbacks or for not making progress.  Try to recognize your accomplishments, even if they are small.  Keep all follow-up visits as told by your health care provider. This is important. Contact a health care provider if:  Your symptoms do not get better.  Your symptoms get worse.  You have signs of depression, such as: ? A persistently sad, cranky, or irritable mood. ? Loss of enjoyment in activities that used to bring you joy. ? Change in weight or eating. ? Changes in sleeping habits. ? Avoiding friends or family members. ? Loss of energy for normal tasks. ? Feelings of guilt or worthlessness. Get help right away if:  You have serious thoughts about hurting yourself or others. If you ever feel like you may hurt yourself or others, or have thoughts about taking your own life, get help right away. You can go to your nearest emergency department or call:  Your local emergency services (911 in the [place]  A suicide crisis helpline, such as the [place] at 506-166-6053. This is open 24 hours a day.  Summary  Generalized anxiety disorder (GAD) is a mental health disorder that involves worry that is not triggered by a specific event.  People with GAD often worry excessively about many things in their lives, such as their health and family.  GAD may cause physical symptoms such as restlessness, trouble concentrating, sleep problems, frequent sweating, nausea, diarrhea, headaches, and trembling or muscle twitching.  A mental health specialist can help determine which treatment is best for you. Some people see improvement with one type of therapy. However, other people require a combination of therapies. This information is not intended to replace advice given to you by your health care provider. Make sure you discuss any questions you have with your health care provider. Document Released: 02/28/2013 Document Revised: 09/23/2016 Document Reviewed: 09/23/2016 Elsevier Interactive Patient Education  [employer].

## 2017-06-22 NOTE — Assessment & Plan Note (Signed)
Encouraged good sleep hygiene such as dark, quiet room. No blue/green glowing lights such as computer screens in bedroom. No alcohol or stimulants in evening. Cut down on caffeine as able. Regular exercise is helpful but not just prior to bed time.  

## 2017-08-11 MED FILL — FLUoxetine HCL 10 MG TABS: 10 | 30 days supply | Qty: 60 | Fill #2

## 2017-08-12 DIAGNOSIS — H5212 Myopia, left eye: Secondary | ICD-10-CM | POA: Diagnosis not present

## 2017-08-18 DIAGNOSIS — R6884 Jaw pain: Secondary | ICD-10-CM | POA: Diagnosis not present

## 2017-08-18 DIAGNOSIS — M542 Cervicalgia: Secondary | ICD-10-CM | POA: Diagnosis not present

## 2017-08-25 DIAGNOSIS — R6884 Jaw pain: Secondary | ICD-10-CM | POA: Diagnosis not present

## 2017-08-25 DIAGNOSIS — M542 Cervicalgia: Secondary | ICD-10-CM | POA: Diagnosis not present

## 2017-09-02 DIAGNOSIS — R6884 Jaw pain: Secondary | ICD-10-CM | POA: Diagnosis not present

## 2017-09-02 DIAGNOSIS — M542 Cervicalgia: Secondary | ICD-10-CM | POA: Diagnosis not present

## 2017-09-10 DIAGNOSIS — M542 Cervicalgia: Secondary | ICD-10-CM | POA: Diagnosis not present

## 2017-09-10 DIAGNOSIS — R6884 Jaw pain: Secondary | ICD-10-CM | POA: Diagnosis not present

## 2017-09-14 DIAGNOSIS — R6884 Jaw pain: Secondary | ICD-10-CM | POA: Diagnosis not present

## 2017-09-14 DIAGNOSIS — M542 Cervicalgia: Secondary | ICD-10-CM | POA: Diagnosis not present

## 2017-09-16 DIAGNOSIS — F332 Major depressive disorder, recurrent severe without psychotic features: Secondary | ICD-10-CM | POA: Diagnosis not present

## 2017-09-16 MED FILL — lamoTRIgine 100 MG TABS: 100 | 30 days supply | Qty: 30 | Fill #0

## 2017-09-16 MED FILL — lamoTRIgine 25 MG TABS: 25 | 30 days supply | Qty: 45 | Fill #0

## 2017-10-12 DIAGNOSIS — F331 Major depressive disorder, recurrent, moderate: Secondary | ICD-10-CM | POA: Diagnosis not present

## 2017-10-13 DIAGNOSIS — R6884 Jaw pain: Secondary | ICD-10-CM | POA: Diagnosis not present

## 2017-10-13 DIAGNOSIS — M542 Cervicalgia: Secondary | ICD-10-CM | POA: Diagnosis not present

## 2017-10-14 DIAGNOSIS — F332 Major depressive disorder, recurrent severe without psychotic features: Secondary | ICD-10-CM | POA: Diagnosis not present

## 2017-10-24 DIAGNOSIS — F331 Major depressive disorder, recurrent, moderate: Secondary | ICD-10-CM | POA: Diagnosis not present

## 2017-11-05 DIAGNOSIS — F332 Major depressive disorder, recurrent severe without psychotic features: Secondary | ICD-10-CM | POA: Diagnosis not present

## 2017-11-06 MED FILL — lamoTRIgine 100 MG TABS: 100 | 90 days supply | Qty: 90 | Fill #0

## 2017-11-24 DIAGNOSIS — F331 Major depressive disorder, recurrent, moderate: Secondary | ICD-10-CM | POA: Diagnosis not present

## 2017-12-17 DIAGNOSIS — F332 Major depressive disorder, recurrent severe without psychotic features: Secondary | ICD-10-CM | POA: Diagnosis not present

## 2018-01-05 DIAGNOSIS — F331 Major depressive disorder, recurrent, moderate: Secondary | ICD-10-CM | POA: Diagnosis not present

## 2018-01-25 MED FILL — lamoTRIgine 150 MG TABS: 150 | 90 days supply | Qty: 90 | Fill #0 | Status: TO

## 2018-01-27 DIAGNOSIS — F331 Major depressive disorder, recurrent, moderate: Secondary | ICD-10-CM | POA: Diagnosis not present

## 2018-02-05 ENCOUNTER — Encounter: Payer: Self-pay | Admitting: Family Medicine

## 2018-02-10 DIAGNOSIS — F332 Major depressive disorder, recurrent severe without psychotic features: Secondary | ICD-10-CM | POA: Diagnosis not present

## 2018-02-15 DIAGNOSIS — F331 Major depressive disorder, recurrent, moderate: Secondary | ICD-10-CM | POA: Diagnosis not present

## 2018-02-23 ENCOUNTER — Encounter: Payer: Self-pay | Admitting: Obstetrics & Gynecology

## 2018-03-18 ENCOUNTER — Encounter: Payer: Self-pay | Admitting: Family Medicine

## 2018-03-18 ENCOUNTER — Ambulatory Visit (INDEPENDENT_AMBULATORY_CARE_PROVIDER_SITE_OTHER): Payer: 59 | Admitting: Family Medicine

## 2018-03-18 DIAGNOSIS — F418 Other specified anxiety disorders: Secondary | ICD-10-CM | POA: Diagnosis not present

## 2018-03-18 DIAGNOSIS — T7840XD Allergy, unspecified, subsequent encounter: Secondary | ICD-10-CM | POA: Diagnosis not present

## 2018-03-18 DIAGNOSIS — Z0000 Encounter for general adult medical examination without abnormal findings: Secondary | ICD-10-CM

## 2018-03-18 DIAGNOSIS — K589 Irritable bowel syndrome without diarrhea: Secondary | ICD-10-CM

## 2018-03-18 MED ORDER — ESCITALOPRAM OXALATE 5 MG PO TABS: 5.0000 mg | ORAL_TABLET | Freq: Every day | ORAL | 1 refills | Status: AC

## 2018-03-18 MED FILL — ESCITALOPRAM 5 MG TABLET: 5 | 90 days supply | Qty: 90 | Fill #0

## 2018-03-18 NOTE — Patient Instructions (Signed)
Preventive Care 18-39 Years, Female Preventive care refers to lifestyle choices and visits with your health care provider that can promote health and wellness. What does preventive care include?  A yearly physical exam. This is also called an annual well check.  Dental exams once or twice a year.  Routine eye exams. Ask your health care provider how often you should have your eyes checked.  Personal lifestyle choices, including: ? Daily care of your teeth and gums. ? Regular physical activity. ? Eating a healthy diet. ? Avoiding tobacco and drug use. ? Limiting alcohol use. ? Practicing safe sex. ? Taking vitamin and mineral supplements as recommended by your health care provider. What happens during an annual well check? The services and screenings done by your health care provider during your annual well check will depend on your age, overall health, lifestyle risk factors, and family history of disease. Counseling Your health care provider may ask you questions about your:  Alcohol use.  Tobacco use.  Drug use.  Emotional well-being.  Home and relationship well-being.  Sexual activity.  Eating habits.  Work and work Statistician.  Method of birth control.  Menstrual cycle.  Pregnancy history.  Screening You may have the following tests or measurements:  Height, weight, and BMI.  Diabetes screening. This is done by checking your blood sugar (glucose) after you have not eaten for a while (fasting).  Blood pressure.  Lipid and cholesterol levels. These may be checked every 5 years starting at age 66.  Skin check.  Hepatitis C blood test.  Hepatitis B blood test.  Sexually transmitted disease (STD) testing.  BRCA-related cancer screening. This may be done if you have a family history of breast, ovarian, tubal, or peritoneal cancers.  Pelvic exam and Pap test. This may be done every 3 years starting at age 40. Starting at age 59, this may be done every 5  years if you have a Pap test in combination with an HPV test.  Discuss your test results, treatment options, and if necessary, the need for more tests with your health care provider. Vaccines Your health care provider may recommend certain vaccines, such as:  Influenza vaccine. This is recommended every year.  Tetanus, diphtheria, and acellular pertussis (Tdap, Td) vaccine. You may need a Td booster every 10 years.  Varicella vaccine. You may need this if you have not been vaccinated.  HPV vaccine. If you are 69 or younger, you may need three doses over 6 months.  Measles, mumps, and rubella (MMR) vaccine. You may need at least one dose of MMR. You may also need a second dose.  Pneumococcal 13-valent conjugate (PCV13) vaccine. You may need this if you have certain conditions and were not previously vaccinated.  Pneumococcal polysaccharide (PPSV23) vaccine. You may need one or two doses if you smoke cigarettes or if you have certain conditions.  Meningococcal vaccine. One dose is recommended if you are age 27-21 years and a first-year college student living in a residence hall, or if you have one of several medical conditions. You may also need additional booster doses.  Hepatitis A vaccine. You may need this if you have certain conditions or if you travel or work in places where you may be exposed to hepatitis A.  Hepatitis B vaccine. You may need this if you have certain conditions or if you travel or work in places where you may be exposed to hepatitis B.  Haemophilus influenzae type b (Hib) vaccine. You may need this if  you have certain risk factors.  Talk to your health care provider about which screenings and vaccines you need and how often you need them. This information is not intended to replace advice given to you by your health care provider. Make sure you discuss any questions you have with your health care provider. Document Released: 12/30/2001 Document Revised: 07/23/2016  Document Reviewed: 09/04/2015 Elsevier Interactive Patient Education  [employer].

## 2018-03-18 NOTE — Assessment & Plan Note (Signed)
Is following with Dr Bobetta Lime of psychiatry in [facility] and she is doing well on the Lamictal at 150 mg and they had taken her off of the Lexapro. She wonders if a small amount of Lexapro added back would help her manage so she started on Lexapro 5 mg tabs daily for a month. It has helped the IBS and anxiety side. Has a great counselor Everardo Beals in [place] and that is also helpful. They have suggested she might be Bipolar II, they will address her concerns then she might not need meds then they will decide on medications moving forward.

## 2018-03-18 NOTE — Assessment & Plan Note (Signed)
Patient encouraged to maintain heart healthy diet, regular exercise, adequate sleep. Consider daily probiotics. Take medications as prescribed 

## 2018-03-18 NOTE — Assessment & Plan Note (Signed)
Better on SSRI but has flared off and on.

## 2018-03-18 NOTE — Assessment & Plan Note (Signed)
Got a new dog recently. Has had a flare recently but has responded to Zyrtec and Flonase. Try nasal saline flushes

## 2018-03-18 NOTE — Progress Notes (Signed)
Subjective:  I acted as a Education administrator for [employer]. Yancey Flemings, [place]    Patient ID: Ruth Cole, female    DOB: [birth year], 42 y.o.   MRN: [id number]  Chief Complaint  Patient presents with  . Annual Exam    HPI  Patient is in today for annual exam and she is doing well. No recent febrile illness or hospitalizations. Stays active and tries to maintain a heart healthy diet. She is following with Dr Bobetta Lime, psychiatry and she feels the Lamictal has really helped her especially the anxiety side but she does note a bit less motivation and some anhedonia so she restart Lexapro at 5 mg daily with it and feels that is helping. She is also seeing a counselor, Everardo Beals and that has been very helpful. Denies CP/palp/SOB/HA/congestion/fevers/GI or GU c/o. Taking meds as prescribed  Patient Care Team: Mosie Lukes, MD as PCP - General (Family Medicine)   Past Medical History:  Diagnosis Date  . Acute upper respiratory infection 11/22/2013  . Allergy    seasonal  . Chicken pox as a child  . Depression with anxiety 12/23/2015  . H/O sinusitis    improved s/p surgical repair of deviated septum  . History of chicken pox   . IBS (irritable bowel syndrome) 02/02/2012  . Insomnia 02/02/2012  . Miscarriage within last 12 months 12/12/2013  . Onychomycosis 10/02/2013  . Overweight 02/02/2017  . PONV (postoperative nausea and vomiting)   . Postpartum care following vaginal delivery (11/15) 10/01/2014  . Preventative health care 02/02/2012  . Urinary incontinence    s/p 3rd vaginal delivery, occasional  . Vaginal delivery    X 3    Past Surgical History:  Procedure Laterality Date  . CLEFT PALATE REPAIR  as a child  . deviated septum repair  teenager  . DILATION AND EVACUATION N/A 02/24/2013   Procedure: DILATATION AND EVACUATION;  Surgeon: Princess Bruins, MD;  Location: [facility];  Service: Gynecology;  Laterality: N/A;  . DILATION AND EVACUATION N/A 08/24/2013   Procedure: DILATATION AND  EVACUATION;  Surgeon: Princess Bruins, MD;  Location: [facility];  Service: Gynecology;  Laterality: N/A;  . MANDIBLE FRACTURE SURGERY  2008  . skin biopsy     multiple all benign    Family History  Problem Relation Age of Onset  . Hypertension Mother   . Hyperlipidemia Mother   . Diabetes Father        type 2  . Kidney disease Father   . Alcohol abuse Father   . Stroke Maternal Grandmother        few  . Cancer Maternal Grandfather        stomach  . Heart attack Maternal Grandfather        few  . Cancer Paternal Grandfather        lung- nonsmoker    Social History   Socioeconomic History  . Marital status: Married    Spouse name: Not on file  . Number of children: Not on file  . Years of education: Not on file  . Highest education level: Not on file  Occupational History  . Not on file  Social Needs  . Financial resource strain: Not on file  . Food insecurity:    Worry: Not on file    Inability: Not on file  . Transportation needs:    Medical: Not on file    Non-medical: Not on file  Tobacco Use  . Smoking status: Never Smoker  .  Smokeless tobacco: Never Used  Substance and Sexual Activity  . Alcohol use: No  . Drug use: No  . Sexual activity: Yes    Partners: Male    Comment: approx [redacted] wks gestation  Lifestyle  . Physical activity:    Days per week: Not on file    Minutes per session: Not on file  . Stress: Not on file  Relationships  . Social connections:    Talks on phone: Not on file    Gets together: Not on file    Attends religious service: Not on file    Active member of club or organization: Not on file    Attends meetings of clubs or organizations: Not on file    Relationship status: Not on file  . Intimate partner violence:    Fear of current or ex partner: Not on file    Emotionally abused: Not on file    Physically abused: Not on file    Forced sexual activity: Not on file  Other Topics Concern  . Not on file  Social History Narrative    . Not on file    Outpatient Medications Prior to Visit  Medication Sig Dispense Refill  . cetirizine (ZYRTEC) 10 MG tablet Take 1 tablet (10 mg total) by mouth daily as needed for allergies. 30 tablet 5  . fluticasone (FLONASE) 50 MCG/ACT nasal spray Place 2 sprays into both nostrils daily as needed for allergies or rhinitis. 16 g 6  . Multiple Vitamins-Minerals (WOMENS MULTIVITAMIN) TABS Take by mouth daily.    . Probiotic Product (PROBIOTIC DAILY Cole) Take by mouth.    Marland Kitchen buPROPion (WELLBUTRIN SR) 150 MG 12 hr tablet Take 1 tablet (150 mg total) by mouth 2 (two) times daily. 60 tablet 3  . FLUoxetine (PROZAC) 10 MG tablet Take 1 tablet (10 mg total) by mouth 2 (two) times daily. 60 tablet 3  . lamoTRIgine (LAMICTAL) 150 MG tablet Take 1 tablet (150 mg total) by mouth daily. 90 tablet 1   No facility-administered medications prior to visit.     No Known Allergies  Review of Systems  Constitutional: Negative for fever and malaise/fatigue.  HENT: Negative for congestion.   Eyes: Negative for blurred vision.  Respiratory: Negative for shortness of breath.   Cardiovascular: Negative for chest pain, palpitations and leg swelling.  Gastrointestinal: Negative for abdominal pain, blood in stool and nausea.  Genitourinary: Negative for dysuria and frequency.  Musculoskeletal: Negative for falls.  Skin: Negative for rash.  Neurological: Negative for dizziness, loss of consciousness and headaches.  Endo/Heme/Allergies: Negative for environmental allergies.  Psychiatric/Behavioral: Negative for depression. The patient is nervous/anxious.        Objective:    Physical Exam  Constitutional: She is oriented to person, place, and time. No distress.  HENT:  Head: Normocephalic and atraumatic.  Right Ear: External ear normal.  Left Ear: External ear normal.  Nose: Nose normal.  Mouth/Throat: Oropharynx is clear and moist. No oropharyngeal exudate.  Eyes: Pupils are equal, round, and  reactive to light. Conjunctivae are normal. Right eye exhibits no discharge. Left eye exhibits no discharge. No scleral icterus.  Neck: Normal range of motion. Neck supple. No thyromegaly present.  Cardiovascular: Normal rate, regular rhythm, normal heart sounds and intact distal pulses.  No murmur heard. Pulmonary/Chest: Effort normal and breath sounds normal. No respiratory distress. She has no wheezes. She has no rales.  Abdominal: Soft. Bowel sounds are normal. She exhibits no distension and no mass. There is  no tenderness.  Musculoskeletal: Normal range of motion. She exhibits no edema or tenderness.  Lymphadenopathy:    She has no cervical adenopathy.  Neurological: She is alert and oriented to person, place, and time. She has normal reflexes. She displays normal reflexes. No cranial nerve deficit. Coordination normal.  Skin: Skin is warm and dry. No rash noted. She is not diaphoretic.    BP 102/68 (BP Location: Left Arm, Patient Position: Sitting, Cuff Size: Normal)   Pulse 73   Temp 98.6 F (37 C) (Oral)   Resp 18   Ht 5\' 4"  (1.626 m)   Wt 149 lb (67.6 kg)   SpO2 99%   BMI 25.58 kg/m  Wt Readings from Last 3 Encounters:  03/18/18 149 lb (67.6 kg)  06/22/17 156 lb 6.4 oz (70.9 kg)  05/15/17 153 lb 6.4 oz (69.6 kg)   BP Readings from Last 3 Encounters:  03/18/18 102/68  06/22/17 118/70  05/15/17 118/72     Immunization History  Administered Date(s) Administered  . Influenza Whole 08/18/2011  . Influenza-Unspecified 08/17/2013, 08/17/2014, 08/18/2015  . Tdap 11/17/2008    Health Maintenance  Topic Date Due  . INFLUENZA VACCINE  06/17/2018  . TETANUS/TDAP  11/17/2018  . PAP SMEAR  01/16/2019  . HIV Screening  Completed    Lab Results  Component Value Date   WBC 8.8 03/18/2018   HGB 14.4 03/18/2018   HCT 42.5 03/18/2018   PLT 362.0 03/18/2018   GLUCOSE 82 03/18/2018   CHOL 120 03/18/2018   TRIG 221.0 (H) 03/18/2018   HDL 29.30 (L) 03/18/2018   LDLDIRECT  68.0 03/18/2018   LDLCALC 80 02/02/2017   ALT 14 03/18/2018   AST 17 03/18/2018   NA 138 03/18/2018   K 4.3 03/18/2018   CL 100 03/18/2018   CREATININE 0.68 03/18/2018   BUN 13 03/18/2018   CO2 29 03/18/2018   TSH 0.84 03/18/2018    Lab Results  Component Value Date   TSH 0.84 03/18/2018   Lab Results  Component Value Date   WBC 8.8 03/18/2018   HGB 14.4 03/18/2018   HCT 42.5 03/18/2018   MCV 92.3 03/18/2018   PLT 362.0 03/18/2018   Lab Results  Component Value Date   NA 138 03/18/2018   K 4.3 03/18/2018   CO2 29 03/18/2018   GLUCOSE 82 03/18/2018   BUN 13 03/18/2018   CREATININE 0.68 03/18/2018   BILITOT 0.5 03/18/2018   ALKPHOS 64 03/18/2018   AST 17 03/18/2018   ALT 14 03/18/2018   PROT 7.1 03/18/2018   ALBUMIN 4.5 03/18/2018   CALCIUM 9.5 03/18/2018   GFR 101.12 03/18/2018   Lab Results  Component Value Date   CHOL 120 03/18/2018   Lab Results  Component Value Date   HDL 29.30 (L) 03/18/2018   Lab Results  Component Value Date   LDLCALC 80 02/02/2017   Lab Results  Component Value Date   TRIG 221.0 (H) 03/18/2018   Lab Results  Component Value Date   CHOLHDL 4 03/18/2018   No results found for: HGBA1C       Assessment & Plan:   Problem List Items Addressed This Visit    Preventative health care    Patient encouraged to maintain heart healthy diet, regular exercise, adequate sleep. Consider daily probiotics. Take medications as prescribed.      Relevant Orders   CBC (Completed)   Comprehensive metabolic panel (Completed)   Lipid panel (Completed)   TSH (Completed)   IBS (  irritable bowel syndrome)    Better on SSRI but has flared off and on.       Allergy    Got a new dog recently. Has had a flare recently but has responded to Zyrtec and Flonase. Try nasal saline flushes      Depression with anxiety    Is following with Dr Bobetta Lime of psychiatry in [facility] and she is doing well on the Lamictal at 150 mg and they had taken her off of  the Lexapro. She wonders if a small amount of Lexapro added back would help her manage so she started on Lexapro 5 mg tabs daily for a month. It has helped the IBS and anxiety side. Has a great counselor Everardo Beals in [place] and that is also helpful. They have suggested she might be Bipolar II, they will address her concerns then she might not need meds then they will decide on medications moving forward.       Relevant Medications   escitalopram (LEXAPRO) 5 MG tablet      I have discontinued Bryson Ha L. Campas's FLUoxetine and buPROPion. I am also having her start on escitalopram. Additionally, I am having her maintain her Probiotic Product (PROBIOTIC DAILY Cole), WOMENS MULTIVITAMIN, fluticasone, cetirizine, and lamoTRIgine.  Meds ordered this encounter  Medications  . escitalopram (LEXAPRO) 5 MG tablet    Sig: Take 1 tablet (5 mg total) by mouth daily.    Dispense:  90 tablet    Refill:  1    CMA served as Education administrator during this visit. History, Physical and Plan performed by medical provider. Documentation and orders reviewed and attested to.  [name], MD

## 2018-03-19 LAB — COMPREHENSIVE METABOLIC PANEL
ALT: 14 U/L (ref 0–35)
AST: 17 U/L (ref 0–37)
Albumin: 4.5 g/dL (ref 3.5–5.2)
Alkaline Phosphatase: 64 U/L (ref 39–117)
BUN: 13 mg/dL (ref 6–23)
CO2: 29 mEq/L (ref 19–32)
Calcium: 9.5 mg/dL (ref 8.4–10.5)
Chloride: 100 mEq/L (ref 96–112)
Creatinine, Ser: 0.68 mg/dL (ref 0.40–1.20)
GFR: 101.12 mL/min (ref >60.00)
Glucose, Bld: 82 mg/dL (ref 70–99)
Potassium: 4.3 mEq/L (ref 3.5–5.1)
Sodium: 138 mEq/L (ref 135–145)
Total Bilirubin: 0.5 mg/dL (ref 0.2–1.2)
Total Protein: 7.1 g/dL (ref 6.0–8.3)

## 2018-03-19 LAB — CBC
HCT: 42.5 % (ref 36.0–46.0)
Hemoglobin: 14.4 g/dL (ref 12.0–15.0)
MCHC: 33.8 g/dL (ref 30.0–36.0)
MCV: 92.3 fl (ref 78.0–100.0)
Platelets: 362 10*3/uL (ref 150.0–400.0)
RBC: 4.61 Mil/uL (ref 3.87–5.11)
RDW: 12.7 % (ref 11.5–15.5)
WBC: 8.8 10*3/uL (ref 4.0–10.5)

## 2018-03-19 LAB — LIPID PANEL
Cholesterol: 120 mg/dL (ref 0–200)
HDL: 29.3 mg/dL — ABNORMAL LOW (ref >39.00)
NonHDL: 90.25
Total CHOL/HDL Ratio: 4
Triglycerides: 221 mg/dL — ABNORMAL HIGH (ref 0.0–149.0)
VLDL: 44.2 mg/dL — ABNORMAL HIGH (ref 0.0–40.0)

## 2018-03-19 LAB — TSH: TSH: 0.84 u[IU]/mL (ref 0.35–4.50)

## 2018-03-19 LAB — LDL CHOLESTEROL, DIRECT: Direct LDL: 68 mg/dL

## 2018-03-24 DIAGNOSIS — F332 Major depressive disorder, recurrent severe without psychotic features: Secondary | ICD-10-CM | POA: Diagnosis not present

## 2018-05-03 MED FILL — lamoTRIgine 150 MG TABS: 150 | 90 days supply | Qty: 90 | Fill #0

## 2018-05-24 DIAGNOSIS — F332 Major depressive disorder, recurrent severe without psychotic features: Secondary | ICD-10-CM | POA: Diagnosis not present

## 2018-06-07 ENCOUNTER — Encounter: Payer: Self-pay | Admitting: Obstetrics & Gynecology

## 2018-06-08 DIAGNOSIS — F331 Major depressive disorder, recurrent, moderate: Secondary | ICD-10-CM | POA: Diagnosis not present

## 2018-06-10 ENCOUNTER — Encounter: Payer: Self-pay | Admitting: Obstetrics & Gynecology

## 2018-06-10 DIAGNOSIS — Z0289 Encounter for other administrative examinations: Secondary | ICD-10-CM

## 2018-07-05 DIAGNOSIS — F332 Major depressive disorder, recurrent severe without psychotic features: Secondary | ICD-10-CM | POA: Diagnosis not present

## 2018-07-22 DIAGNOSIS — H5213 Myopia, bilateral: Secondary | ICD-10-CM | POA: Diagnosis not present

## 2018-08-10 MED FILL — lamoTRIgine 150 MG TABS: 150 | 90 days supply | Qty: 90 | Fill #0

## 2018-08-24 ENCOUNTER — Ambulatory Visit: Payer: 59 | Admitting: Obstetrics & Gynecology

## 2018-08-25 ENCOUNTER — Encounter: Payer: Self-pay | Admitting: Women's Health

## 2018-08-25 ENCOUNTER — Ambulatory Visit (INDEPENDENT_AMBULATORY_CARE_PROVIDER_SITE_OTHER): Payer: 59 | Admitting: Women's Health

## 2018-08-25 VITALS — BP 118/76 | Ht 64.5 in | Wt 151.0 lb

## 2018-08-25 DIAGNOSIS — N898 Other specified noninflammatory disorders of vagina: Secondary | ICD-10-CM

## 2018-08-25 DIAGNOSIS — R102 Pelvic and perineal pain: Secondary | ICD-10-CM

## 2018-08-25 LAB — WET PREP FOR TRICH, YEAST, CLUE

## 2018-08-25 NOTE — Progress Notes (Signed)
42 year old MWF G7, P4 presents with complaint of intermittent low abdominal cramping and spotting with Mirena IUD that was inserted 4 years ago Dr. Dellis Filbert.  Previously had been amenorrheic.  Denies urinary symptoms, vaginal discharge, nausea, change in bowel elimination or fever.  Pharmacist.  Seasonal allergies, mild anxiety and depression on Lexapro stable, IBS.  Reports good home life, all children are doing well just extremely busy.  M: Appears well.  No CVAT.  Abdomen soft without rebound or radiation of pain.  External genitalia mild erythema, speculum exam no visible erythema, discharge, blood, cervical lesions or odor noted, wet prep negative.  IUD strings visible.  Bimanual no CMT or adnexal tenderness.  No tenderness with exam.  Intermittent abdominal cramping with spotting Mirena IUD  Plan: Reviewed normality of exam and visualization of IUD strings.  Ultrasound offered, declines will keep follow-up scheduled annual exam with Dr. Dellis Filbert.

## 2018-08-26 DIAGNOSIS — F332 Major depressive disorder, recurrent severe without psychotic features: Secondary | ICD-10-CM | POA: Diagnosis not present

## 2018-09-01 ENCOUNTER — Ambulatory Visit: Payer: 59 | Admitting: Obstetrics & Gynecology

## 2018-09-09 MED FILL — TRIAZOLAM 0.25 MG TABLET: 0.25 | 1 days supply | Qty: 1 | Fill #0

## 2018-09-14 MED FILL — NAPROXEN SODIUM 550 MG TABS: 550 | 6 days supply | Qty: 12 | Fill #0

## 2018-09-20 ENCOUNTER — Ambulatory Visit: Payer: Self-pay | Admitting: Family Medicine

## 2018-09-29 ENCOUNTER — Ambulatory Visit (INDEPENDENT_AMBULATORY_CARE_PROVIDER_SITE_OTHER): Payer: 59 | Admitting: Obstetrics & Gynecology

## 2018-09-29 ENCOUNTER — Encounter: Payer: Self-pay | Admitting: Obstetrics & Gynecology

## 2018-09-29 VITALS — BP 118/76 | Ht 63.5 in | Wt 154.6 lb

## 2018-09-29 DIAGNOSIS — Z30431 Encounter for routine checking of intrauterine contraceptive device: Secondary | ICD-10-CM

## 2018-09-29 DIAGNOSIS — B373 Candidiasis of vulva and vagina: Secondary | ICD-10-CM

## 2018-09-29 DIAGNOSIS — Z01419 Encounter for gynecological examination (general) (routine) without abnormal findings: Secondary | ICD-10-CM | POA: Diagnosis not present

## 2018-09-29 DIAGNOSIS — B3731 Acute candidiasis of vulva and vagina: Secondary | ICD-10-CM

## 2018-09-29 MED ORDER — FLUCONAZOLE 150 MG PO TABS: 150.0000 mg | ORAL_TABLET | Freq: Once | ORAL | 2 refills | Status: AC

## 2018-09-29 MED FILL — FLUCONAZOLE 150 MG TABS: 150 | 1 days supply | Qty: 1 | Fill #0

## 2018-09-29 NOTE — Progress Notes (Signed)
Ruth Cole Jul 20, 1976 [id number]   History:    42 y.o. [id number] Married. Children all doing well 11, 9, 7 and 3 yo.  RP:  Established patient presenting for annual gyn exam   HPI: Well on Mirena IUD x 12/2014.  Occasional light period.  No pelvic pain.  No pain with IC.  No vaginal itching or odor, mild vaginal discharge.  Breasts normal.  BMI 26.96.  Not exercising regularly.  Health Labs with Fam MD 03/2018.  Triglycerides increased and HDL low, will modify nutrition.   Past medical history,surgical history, family history and social history were all reviewed and documented in the EPIC chart.  Gynecologic History No LMP recorded. (Menstrual status: IUD). Contraception: Mirena IUD x 12/2014 Last Pap: 01/2016. Results were: Negative/HPV HR neg Last mammogram: 01/2017. Results were: normal at [facility]: Never Colonoscopy: Never  Obstetric History OB History  Gravida Para Term Preterm AB Living  7 4 3 1 3 4   SAB TAB Ectopic Multiple Live Births  3 0 0 0 4    # Outcome Date GA Lbr Len/2nd Weight Sex Delivery Anes PTL Lv  7 Term 10/01/14 [redacted]w[redacted]d 07:29 / 00:09 8 lb 1.6 oz (3.674 kg) M Vag-Spont EPI  LIV  6 SAB 07/2013          5 SAB 02/2013             Birth Comments: System Generated. Please review and update pregnancy details.  4 Term 11/05/10    M Vag-Spont EPI  LIV  3 SAB 09/2009          2 Term 09/18/08    M Vag-Spont EPI  LIV  1 Preterm 10/20/06    M Vag-Spont EPI Y LIV     ROS: A ROS was performed and pertinent positives and negatives are included in the history.  GENERAL: No fevers or chills. HEENT: No change in vision, no earache, sore throat or sinus congestion. NECK: No pain or stiffness. CARDIOVASCULAR: No chest pain or pressure. No palpitations. PULMONARY: No shortness of breath, cough or wheeze. GASTROINTESTINAL: No abdominal pain, nausea, vomiting or diarrhea, melena or bright red blood per rectum. GENITOURINARY: No urinary frequency, urgency,  hesitancy or dysuria. MUSCULOSKELETAL: No joint or muscle pain, no back pain, no recent trauma. DERMATOLOGIC: No rash, no itching, no lesions. ENDOCRINE: No polyuria, polydipsia, no heat or cold intolerance. No recent change in weight. HEMATOLOGICAL: No anemia or easy bruising or bleeding. NEUROLOGIC: No headache, seizures, numbness, tingling or weakness. PSYCHIATRIC: No depression, no loss of interest in normal activity or change in sleep pattern.     Exam:   BP 118/76   Ht 5' 3.5" (1.613 m)   Wt 154 lb 9.6 oz (70.1 kg)   BMI 26.96 kg/m   Body mass index is 26.96 kg/m.  General appearance : Well developed well nourished female. No acute distress HEENT: Eyes: no retinal hemorrhage or exudates,  Neck supple, trachea midline, no carotid bruits, no thyroidmegaly Lungs: Clear to auscultation, no rhonchi or wheezes, or rib retractions  Heart: Regular rate and rhythm, no murmurs or gallops Breast:Examined in sitting and supine position were symmetrical in appearance, no palpable masses or tenderness,  no skin retraction, no nipple inversion, no nipple discharge, no skin discoloration, no axillary or supraclavicular lymphadenopathy Abdomen: no palpable masses or tenderness, no rebound or guarding Extremities: no edema or skin discoloration or tenderness  Pelvic: Vulva: Normal  Vagina: No gross lesions.  Mild yeast type discharge  Cervix: No gross lesions or discharge.  IUD strings visible.  Pap reflex done  Uterus  AV, normal size, shape and consistency, non-tender and mobile  Adnexa  Without masses or tenderness  Anus: Normal   Assessment/Plan:  42 y.o. female for annual exam   1. Encounter for routine gynecological examination with Papanicolaou smear of cervix Normal gynecologic exam.  Pap reflex done.  Breast exam normal.  Screening mammogram March 2018 was normal.  Schedule screening mammogram now.  Health labs with family physician.  Body mass index 26.96.  Increase  physical activity with aerobic activities 5 times a week and weightlifting every 2 days.  Low-cholesterol diet recommended.  2. Encounter for routine checking of intrauterine contraceptive device (IUD) Mirena IUD since February 2016 in good location and well-tolerated.  3. Yeast vaginitis Yeast vaginitis per vaginal discharge.  Recommend fluconazole treatment, usage reviewed.  Fluconazole prescription sent to pharmacy.  Can use probiotic for prevention as needed.  Other orders - fluconazole (DIFLUCAN) 150 MG tablet; Take 1 tablet (150 mg total) by mouth once for 1 dose.  Princess Bruins MD, 2:18 PM 09/29/2018

## 2018-09-30 ENCOUNTER — Other Ambulatory Visit: Payer: Self-pay | Admitting: Obstetrics & Gynecology

## 2018-09-30 DIAGNOSIS — Z1231 Encounter for screening mammogram for malignant neoplasm of breast: Secondary | ICD-10-CM

## 2018-10-01 LAB — PAP IG W/ RFLX HPV ASCU

## 2018-10-02 ENCOUNTER — Encounter: Payer: Self-pay | Admitting: Obstetrics & Gynecology

## 2018-10-02 NOTE — Patient Instructions (Signed)
1. Encounter for routine gynecological examination with Papanicolaou smear of cervix Normal gynecologic exam.  Pap reflex done.  Breast exam normal.  Screening mammogram March 2018 was normal.  Schedule screening mammogram now.  Health labs with family physician.  Body mass index 26.96.  Increase physical activity with aerobic activities 5 times a week and weightlifting every 2 days.  Low-cholesterol diet recommended.  2. Encounter for routine checking of intrauterine contraceptive device (IUD) Mirena IUD since February 2016 in good location and well-tolerated.  3. Yeast vaginitis Yeast vaginitis per vaginal discharge.  Recommend fluconazole treatment, usage reviewed.  Fluconazole prescription sent to pharmacy.  Can use probiotic for prevention as needed.  Other orders - fluconazole (DIFLUCAN) 150 MG tablet; Take 1 tablet (150 mg total) by mouth once for 1 dose.  Nea, it was a pleasure seeing you today!  I will inform you of your results as soon as they are available.

## 2018-11-18 DIAGNOSIS — F331 Major depressive disorder, recurrent, moderate: Secondary | ICD-10-CM | POA: Diagnosis not present

## 2018-11-18 MED FILL — lamoTRIgine 150 MG TABS: 150 | 90 days supply | Qty: 90 | Fill #1

## 2018-11-24 ENCOUNTER — Ambulatory Visit
Admission: RE | Admit: 2018-11-24 | Discharge: 2018-11-24 | Disposition: A | Discharge status: Home / Self Care | Payer: 59 | Source: Ambulatory Visit | Attending: Obstetrics & Gynecology | Admitting: Obstetrics & Gynecology

## 2018-11-24 DIAGNOSIS — Z1231 Encounter for screening mammogram for malignant neoplasm of breast: Secondary | ICD-10-CM

## 2018-11-24 MED FILL — TRIAZOLAM 0.25 MG TABLET: 0.25 | 1 days supply | Qty: 1 | Fill #0

## 2018-11-30 DIAGNOSIS — F331 Major depressive disorder, recurrent, moderate: Secondary | ICD-10-CM | POA: Diagnosis not present

## 2018-12-07 ENCOUNTER — Other Ambulatory Visit: Payer: Self-pay | Admitting: Family Medicine

## 2018-12-07 ENCOUNTER — Encounter: Payer: Self-pay | Admitting: Family Medicine

## 2018-12-07 MED ORDER — OSELTAMIVIR PHOSPHATE 75 MG PO CAPS: 75.0000 mg | ORAL_CAPSULE | Freq: Every day | ORAL | 0 refills | Status: DC

## 2018-12-08 MED FILL — OSELTAMIVIR PHOS 75 MG CAP: 75 | 7 days supply | Qty: 7 | Fill #0

## 2018-12-21 DIAGNOSIS — F331 Major depressive disorder, recurrent, moderate: Secondary | ICD-10-CM | POA: Diagnosis not present

## 2019-01-04 DIAGNOSIS — M9901 Segmental and somatic dysfunction of cervical region: Secondary | ICD-10-CM | POA: Diagnosis not present

## 2019-01-05 DIAGNOSIS — M9901 Segmental and somatic dysfunction of cervical region: Secondary | ICD-10-CM | POA: Diagnosis not present

## 2019-01-11 DIAGNOSIS — M5412 Radiculopathy, cervical region: Secondary | ICD-10-CM | POA: Diagnosis not present

## 2019-01-11 DIAGNOSIS — F331 Major depressive disorder, recurrent, moderate: Secondary | ICD-10-CM | POA: Diagnosis not present

## 2019-02-10 DIAGNOSIS — F332 Major depressive disorder, recurrent severe without psychotic features: Secondary | ICD-10-CM | POA: Diagnosis not present

## 2019-02-10 MED FILL — ESCITALOPRAM 5 MG TABLET: 5 | 90 days supply | Qty: 90 | Fill #0

## 2019-02-10 MED FILL — SUBVENITE 150 MG TABS: 150 | 90 days supply | Qty: 90 | Fill #0

## 2019-02-18 ENCOUNTER — Encounter: Payer: Self-pay | Admitting: Family

## 2019-02-18 ENCOUNTER — Telehealth: Payer: 59 | Admitting: Family

## 2019-02-18 DIAGNOSIS — R059 Cough, unspecified: Secondary | ICD-10-CM

## 2019-02-18 DIAGNOSIS — R05 Cough: Secondary | ICD-10-CM

## 2019-02-18 MED ORDER — OSELTAMIVIR PHOSPHATE 75 MG PO CAPS: 75.0000 mg | ORAL_CAPSULE | Freq: Two times a day (BID) | ORAL | 0 refills | Status: DC

## 2019-02-18 NOTE — Progress Notes (Signed)

## 2019-05-11 DIAGNOSIS — F332 Major depressive disorder, recurrent severe without psychotic features: Secondary | ICD-10-CM | POA: Diagnosis not present

## 2019-05-17 MED FILL — ESCITALOPRAM 5 MG TABLET: 5 | 90 days supply | Qty: 90 | Fill #1

## 2019-05-17 MED FILL — SUBVENITE 150 MG TABS: 150 | 90 days supply | Qty: 90 | Fill #1

## 2019-05-17 MED FILL — SF 5000 PLUS CREAM: 1.1 | 20 days supply | Qty: 51 | Fill #0

## 2019-07-14 DIAGNOSIS — F331 Major depressive disorder, recurrent, moderate: Secondary | ICD-10-CM | POA: Diagnosis not present

## 2019-07-28 DIAGNOSIS — H5213 Myopia, bilateral: Secondary | ICD-10-CM | POA: Diagnosis not present

## 2019-08-03 DIAGNOSIS — F332 Major depressive disorder, recurrent severe without psychotic features: Secondary | ICD-10-CM | POA: Diagnosis not present

## 2019-08-04 MED FILL — ESCITALOPRAM 5 MG TABLET: 5 | 90 days supply | Qty: 90 | Fill #0

## 2019-08-04 MED FILL — SUBVENITE 150 MG TABS: 150 | 90 days supply | Qty: 90 | Fill #0

## 2019-08-04 MED FILL — hydrOXYzine HCL 10 MG TABS: 10 | 30 days supply | Qty: 60 | Fill #0

## 2019-08-18 DIAGNOSIS — F331 Major depressive disorder, recurrent, moderate: Secondary | ICD-10-CM | POA: Diagnosis not present

## 2019-08-29 ENCOUNTER — Other Ambulatory Visit: Payer: Self-pay | Admitting: Obstetrics & Gynecology

## 2019-08-29 DIAGNOSIS — Z1231 Encounter for screening mammogram for malignant neoplasm of breast: Secondary | ICD-10-CM

## 2019-09-07 ENCOUNTER — Ambulatory Visit: Payer: Self-pay | Admitting: *Deleted

## 2019-09-07 DIAGNOSIS — F332 Major depressive disorder, recurrent severe without psychotic features: Secondary | ICD-10-CM | POA: Diagnosis not present

## 2019-09-07 NOTE — Telephone Encounter (Signed)
FYI

## 2019-09-07 NOTE — Telephone Encounter (Signed)
Patient calls with a dull pressure under her left breast. Seems to be constant pressure-like today.  Started yesterday and felt it was across her entire chest. Does not radiate. Feels more intense with activity and hurts to breath in. Denies SOB/dizziness/sweating. No cold symptoms. Denies injury/activity with upper body except some planting in the yard (not something new for her) on Monday. Had a 10 hour car ride on 08/21/19. No cardiac hx. Uses Mirena as birth control. No history of DVT/blood clots. No fever. Has not tried any relief medication. Try maalox/mylanta to see if any relief is provided. If not, advised immediate evaluation at [facility] or ED. Stated she would.   Reason for Disposition . Taking a deep breath makes pain worse  Answer Assessment - Initial Assessment Questions 1. LOCATION: "Where does it hurt?"       Across the entire chest, today under the left breast. 2. RADIATION: "Does the pain go anywhere else?" (e.g., into neck, jaw, arms, back)     no 3. ONSET: "When did the chest pain begin?" (Minutes, hours or days)      Yesterday around lunch. 4. PATTERN "Does the pain come and go, or has it been constant since it started?"  "Does it get worse with exertion?"      Constant dull pain. With breathing or moving, feels it more 5. DURATION: "How long does it last" (e.g., seconds, minutes, hours)    constant 6. SEVERITY: "How bad is the pain?"  (e.g., Scale 1-10; mild, moderate, or severe)    - MILD (1-3): doesn't interfere with normal activities     - MODERATE (4-7): interferes with normal activities or awakens from sleep    - SEVERE (8-10): excruciating pain, unable to do any normal activities       Moderate. 7. CARDIAC RISK FACTORS: "Do you have any history of heart problems or risk factors for heart disease?" (e.g., angina, prior heart attack; diabetes, high blood pressure, high cholesterol, smoker, or strong family history of heart disease)     no 8. PULMONARY RISK FACTORS: "Do you  have any history of lung disease?"  (e.g., blood clots in lung, asthma, emphysema, birth control pills)     no 9. CAUSE: "What do you think is causing the chest pain?"     Possible planting plants in the yard. 10. OTHER SYMPTOMS: "Do you have any other symptoms?" (e.g., dizziness, nausea, vomiting, sweating, fever, difficulty breathing, cough)       no 11. PREGNANCY: "Is there any chance you are pregnant?" "When was your last menstrual period?"      no  Protocols used: CHEST PAIN-A-AH

## 2019-09-15 DIAGNOSIS — F331 Major depressive disorder, recurrent, moderate: Secondary | ICD-10-CM | POA: Diagnosis not present

## 2019-09-29 DIAGNOSIS — F331 Major depressive disorder, recurrent, moderate: Secondary | ICD-10-CM | POA: Diagnosis not present

## 2019-10-04 ENCOUNTER — Encounter: Payer: Self-pay | Admitting: Obstetrics & Gynecology

## 2019-10-04 ENCOUNTER — Other Ambulatory Visit: Payer: Self-pay

## 2019-10-04 ENCOUNTER — Ambulatory Visit (INDEPENDENT_AMBULATORY_CARE_PROVIDER_SITE_OTHER): Payer: 59 | Admitting: Obstetrics & Gynecology

## 2019-10-04 VITALS — BP 134/88 | Ht 63.75 in | Wt 154.0 lb

## 2019-10-04 DIAGNOSIS — Z01419 Encounter for gynecological examination (general) (routine) without abnormal findings: Secondary | ICD-10-CM | POA: Diagnosis not present

## 2019-10-04 DIAGNOSIS — Z30431 Encounter for routine checking of intrauterine contraceptive device: Secondary | ICD-10-CM

## 2019-10-04 NOTE — Progress Notes (Signed)
Ruth Cole September 02, 1976 [id number]   History:    43 y.o.  [id number] Married. Children all doing well 12, 10, 8 and 101 yo.  RP:  Established patient presenting for annual gyn exam   HPI: Well on Mirena IUD x 12/2014.  No vaginal bleeding.  No pelvic pain.  No pain with IC.  No vaginal discharge.  Breasts normal.  Followed by psychiatrist/Psychotherapist for Major Depression, well on Lexapro and Lamictal.  BMI 26.64  Exercising regularly.  Health Labs with Fam MD.   Past medical history,surgical history, family history and social history were all reviewed and documented in the EPIC chart.  Gynecologic History No LMP recorded. (Menstrual status: IUD). Contraception: Mirena IUD x 12/2014 Last Pap: 09/2018. Results were: Negative Last mammogram: 11/2018. Results were: Negative Bone Density: Never Colonoscopy: Never  Obstetric History OB History  Gravida Para Term Preterm AB Living  7 4 3 1 3 4   SAB TAB Ectopic Multiple Live Births  3 0 0 0 4    # Outcome Date GA Lbr Len/2nd Weight Sex Delivery Anes PTL Lv  7 Term 10/01/14 [redacted]w[redacted]d 07:29 / 00:09 8 lb 1.6 oz (3.674 kg) M Vag-Spont EPI  LIV  6 SAB 07/2013          5 SAB 02/2013             Birth Comments: System Generated. Please review and update pregnancy details.  4 Term 11/05/10    M Vag-Spont EPI  LIV  3 SAB 09/2009          2 Term 09/18/08    M Vag-Spont EPI  LIV  1 Preterm 10/20/06    M Vag-Spont EPI Y LIV     ROS: A ROS was performed and pertinent positives and negatives are included in the history.  GENERAL: No fevers or chills. HEENT: No change in vision, no earache, sore throat or sinus congestion. NECK: No pain or stiffness. CARDIOVASCULAR: No chest pain or pressure. No palpitations. PULMONARY: No shortness of breath, cough or wheeze. GASTROINTESTINAL: No abdominal pain, nausea, vomiting or diarrhea, melena or bright red blood per rectum. GENITOURINARY: No urinary frequency, urgency, hesitancy or dysuria.  MUSCULOSKELETAL: No joint or muscle pain, no back pain, no recent trauma. DERMATOLOGIC: No rash, no itching, no lesions. ENDOCRINE: No polyuria, polydipsia, no heat or cold intolerance. No recent change in weight. HEMATOLOGICAL: No anemia or easy bruising or bleeding. NEUROLOGIC: No headache, seizures, numbness, tingling or weakness. PSYCHIATRIC: No depression, no loss of interest in normal activity or change in sleep pattern.     Exam:   BP 134/88   Ht 5' 3.75" (1.619 m)   Wt 154 lb (69.9 kg)   BMI 26.64 kg/m   Body mass index is 26.64 kg/m.  General appearance : Well developed well nourished female. No acute distress HEENT: Eyes: no retinal hemorrhage or exudates,  Neck supple, trachea midline, no carotid bruits, no thyroidmegaly Lungs: Clear to auscultation, no rhonchi or wheezes, or rib retractions  Heart: Regular rate and rhythm, no murmurs or gallops Breast:Examined in sitting and supine position were symmetrical in appearance, no palpable masses or tenderness,  no skin retraction, no nipple inversion, no nipple discharge, no skin discoloration, no axillary or supraclavicular lymphadenopathy Abdomen: no palpable masses or tenderness, no rebound or guarding Extremities: no edema or skin discoloration or tenderness  Pelvic: Vulva: Normal             Vagina: No gross lesions or discharge  Cervix: No gross lesions or discharge.  [facility]UD strings felt at [facility] of cervix.  Uterus  AV, normal size, shape and consistency, non-tender and mobile  Adnexa  Without masses or tenderness  Anus: Normal   Assessment/Plan:  43 y.o. female for annual exam   1. Well female exam with routine gynecological exam Normal gynecologic exam.  Pap test November 2019 was negative, no indication to repeat the Pap test this year.  Breast exam normal.  Screening mammogram negative in January 2020.  Relatively good body mass index at 26.64, would like to lose about 10 pounds.  Recommend a slightly lower calorie/carb  diet.  Aerobic physical activities 5 times a week and weightlifting every 2 days.  Health labs with family physician.  2. Encounter for routine checking of intrauterine contraceptive device (IUD) Well on Mirena IUD since February 2016.  IUD in good position.  F/U to change the Mirena IUD in 12/2019.   Princess Bruins MD, 4:33 PM 10/04/2019

## 2019-10-08 ENCOUNTER — Encounter: Payer: Self-pay | Admitting: Obstetrics & Gynecology

## 2019-10-08 NOTE — Patient Instructions (Signed)
1. Well female exam with routine gynecological exam Normal gynecologic exam.  Pap test November 2019 was negative, no indication to repeat the Pap test this year.  Breast exam normal.  Screening mammogram negative in January 2020.  Relatively good body mass index at 26.64, would like to lose about 10 pounds.  Recommend a slightly lower calorie/carb diet.  Aerobic physical activities 5 times a week and weightlifting every 2 days.  Health labs with family physician.  2. Encounter for routine checking of intrauterine contraceptive device (IUD) Well on Mirena IUD since February 2016.  IUD in good position.  F/U to change the Mirena IUD in 12/2019.  Ruth Cole, it was a pleasure seeing you today!

## 2019-11-02 DIAGNOSIS — F331 Major depressive disorder, recurrent, moderate: Secondary | ICD-10-CM | POA: Diagnosis not present

## 2019-11-28 ENCOUNTER — Other Ambulatory Visit: Payer: Self-pay

## 2019-11-28 ENCOUNTER — Ambulatory Visit
Admission: RE | Admit: 2019-11-28 | Discharge: 2019-11-28 | Disposition: A | Discharge status: Home / Self Care | Payer: No Typology Code available for payment source | Source: Ambulatory Visit | Attending: Obstetrics & Gynecology | Admitting: Obstetrics & Gynecology

## 2019-11-28 DIAGNOSIS — Z1231 Encounter for screening mammogram for malignant neoplasm of breast: Secondary | ICD-10-CM

## 2019-12-13 MED FILL — ESCITALOPRAM 5 MG TABLET: 5 | 90 days supply | Qty: 90 | Fill #1

## 2019-12-13 MED FILL — SUBVENITE 150 MG TABS: 150 | 90 days supply | Qty: 90 | Fill #1

## 2019-12-21 DIAGNOSIS — F332 Major depressive disorder, recurrent severe without psychotic features: Secondary | ICD-10-CM | POA: Diagnosis not present

## 2019-12-29 ENCOUNTER — Ambulatory Visit: Payer: 59 | Admitting: Obstetrics & Gynecology

## 2020-01-06 ENCOUNTER — Other Ambulatory Visit: Payer: Self-pay

## 2020-01-09 ENCOUNTER — Ambulatory Visit (INDEPENDENT_AMBULATORY_CARE_PROVIDER_SITE_OTHER): Payer: No Typology Code available for payment source | Admitting: Obstetrics & Gynecology

## 2020-01-09 ENCOUNTER — Encounter: Payer: Self-pay | Admitting: Obstetrics & Gynecology

## 2020-01-09 ENCOUNTER — Other Ambulatory Visit: Payer: Self-pay

## 2020-01-09 VITALS — BP 118/80

## 2020-01-09 DIAGNOSIS — Z30433 Encounter for removal and reinsertion of intrauterine contraceptive device: Secondary | ICD-10-CM | POA: Diagnosis not present

## 2020-01-09 NOTE — Progress Notes (Signed)
    Ruth Cole 27-Apr-1976 [id number]        44 y.o.  [id number] Married  RP: Mirena IUD removal/Insertion of a new Mirena IUD  HPI: Well on Mirena IUD, time to change it.     OB History  Gravida Para Term Preterm AB Living  7 4 3 1 3 4   SAB TAB Ectopic Multiple Live Births  3 0 0 0 4    # Outcome Date GA Lbr Len/2nd Weight Sex Delivery Anes PTL Lv  7 Term 10/01/14 [redacted]w[redacted]d 07:29 / 00:09 8 lb 1.6 oz (3.674 kg) M Vag-Spont EPI  LIV  6 SAB 07/2013          5 SAB 02/2013             Birth Comments: System Generated. Please review and update pregnancy details.  4 Term 11/05/10    M Vag-Spont EPI  LIV  3 SAB 09/2009          2 Term 09/18/08    M Vag-Spont EPI  LIV  1 Preterm 10/20/06    M Vag-Spont EPI Y LIV    Past medical history,surgical history, problem list, medications, allergies, family history and social history were all reviewed and documented in the EPIC chart.   Directed ROS with pertinent positives and negatives documented in the history of present illness/assessment and plan.  Exam:  Vitals:   01/09/20 1559  BP: 118/80   General appearance:  Normal                                                                    IUD procedure note       Patient presented to the office today for removal and placement of Mirena IUD. The patient had previously been provided with literature information on this method of contraception. The risks benefits and pros and cons were discussed and all her questions were answered. She is fully aware that this form of contraception is 99% effective and is good for 5 years.  Pelvic exam: Vulva normal Vagina: No lesions or discharge Cervix: No lesions or discharge.  IUD removed easily by pulling on the strings.  IUD complete/intact, shown to patient and discarded. Uterus: Mildly RV position Adnexa: No masses or tenderness Rectal exam: Not done  The cervix was cleansed with Betadine solution. Hurricane spray on the cervix.  Easy passage  of the Os Finder.  Hysterometry at 8 cm. The IUD was shown to the patient and inserted in a sterile fashion.  The IUD string was trimmed. Patient was instructed to return back to the office in one month for follow up.        Assessment/Plan:  44 y.o. [id number]   1. Encounter for removal and reinsertion of intrauterine contraceptive device (IUD) Easy removal of Mirena IUD.  Complete/intact, well tolerated, no Cx.  Insertion of a new Mirena IUD without Cx and well tolerated.  F/U in 4 weeks for IUD check.   Princess Bruins MD, 4:04 PM 01/09/2020

## 2020-01-09 NOTE — Patient Instructions (Addendum)
1. Encounter for removal and reinsertion of intrauterine contraceptive device (IUD) Easy removal of Mirena IUD.  Complete/intact, well tolerated, no Cx.  Insertion of a new Mirena IUD without Cx and well tolerated.  F/U in 4 weeks for IUD check.  Lucero, it was a pleasure seeing you today!

## 2020-01-10 ENCOUNTER — Encounter: Payer: Self-pay | Admitting: Anesthesiology

## 2020-02-08 ENCOUNTER — Other Ambulatory Visit: Payer: Self-pay

## 2020-02-09 ENCOUNTER — Encounter: Payer: Self-pay | Admitting: Obstetrics & Gynecology

## 2020-02-09 ENCOUNTER — Ambulatory Visit: Payer: No Typology Code available for payment source | Admitting: Obstetrics & Gynecology

## 2020-02-09 VITALS — BP 124/82

## 2020-02-09 DIAGNOSIS — Z30431 Encounter for routine checking of intrauterine contraceptive device: Secondary | ICD-10-CM | POA: Diagnosis not present

## 2020-02-09 NOTE — Patient Instructions (Signed)
1. IUD check up New Mirena IUD inserted January 09, 2020.  IUD in good position with no sign of infection.  Well-tolerated.  No complication.  Patient reassured.  We will follow-up at next annual gynecologic exam.  Kollette, it was a pleasure seeing you today!

## 2020-02-09 NOTE — Progress Notes (Signed)
    Ruth Cole 1976-04-05 [id number]        44 y.o.  [id number] married  RP: IUD check post insertion January 09, 2020  HPI: Very well since insertion of Mirena IUD on January 09, 2020.  No pelvic pain, no vaginal bleeding, no abnormal vaginal discharge.  No pain with intercourse.  No fever.   OB History  Gravida Para Term Preterm AB Living  7 4 3 1 3 4   SAB TAB Ectopic Multiple Live Births  3 0 0 0 4    # Outcome Date GA Lbr Len/2nd Weight Sex Delivery Anes PTL Lv  7 Term 10/01/14 [redacted]w[redacted]d 07:29 / 00:09 8 lb 1.6 oz (3.674 kg) M Vag-Spont EPI  LIV  6 SAB 07/2013          5 SAB 02/2013             Birth Comments: System Generated. Please review and update pregnancy details.  4 Term 11/05/10    M Vag-Spont EPI  LIV  3 SAB 09/2009          2 Term 09/18/08    M Vag-Spont EPI  LIV  1 Preterm 10/20/06    M Vag-Spont EPI Y LIV    Past medical history,surgical history, problem list, medications, allergies, family history and social history were all reviewed and documented in the EPIC chart.   Directed ROS with pertinent positives and negatives documented in the history of present illness/assessment and plan.  Exam:   Flowsheets     02/09/20 8:08 AM   BP  124/82   BP Location  RightArm   Patient Position  Sitting   Cuff Size  Normal   Pain Score  0-Nopain      General appearance:  Normal  Abdomen: Normal  Gynecologic exam: Vulva normal.  Speculum: Cervix normal with no erythema.  IUD strings visible at the external os.  Normal secretions.  Vagina normal.   Assessment/Plan:  44 y.o. [id number]   1. IUD check up New Mirena IUD inserted January 09, 2020.  IUD in good position with no sign of infection.  Well-tolerated.  No complication.  Patient reassured.  We will follow-up at next annual gynecologic exam.  Princess Bruins MD, 8:10 AM 02/09/2020

## 2020-02-23 MED FILL — ESCITALOPRAM 10 MG TABLET: 10 | 90 days supply | Qty: 90 | Fill #0

## 2020-02-23 MED FILL — SUBVENITE 150 MG TABS: 150 | 90 days supply | Qty: 90 | Fill #0

## 2020-03-20 MED FILL — SODIUM FLUORIDE 1.1 % GEL: 1.1 | 30 days supply | Qty: 56 | Fill #0

## 2020-04-05 MED FILL — SODIUM FLUORIDE 1.1 % GEL: 1.1 | 30 days supply | Qty: 56 | Fill #0

## 2020-05-23 MED FILL — SUBVENITE 150 MG TABS: 150 | 90 days supply | Qty: 90 | Fill #1

## 2020-05-23 MED FILL — ESCITALOPRAM 10 MG TABLET: 10 | 90 days supply | Qty: 90 | Fill #1

## 2020-08-12 ENCOUNTER — Telehealth: Payer: No Typology Code available for payment source | Admitting: Orthopedic Surgery

## 2020-08-12 DIAGNOSIS — J019 Acute sinusitis, unspecified: Secondary | ICD-10-CM

## 2020-08-12 MED ORDER — AMOXICILLIN-POT CLAVULANATE 875-125 MG PO TABS: 1.0000 | ORAL_TABLET | Freq: Two times a day (BID) | ORAL | 0 refills | Status: AC

## 2020-08-12 NOTE — Progress Notes (Signed)

## 2020-10-04 ENCOUNTER — Encounter: Payer: 59 | Admitting: Obstetrics & Gynecology

## 2021-02-27 IMAGING — MG DIGITAL SCREENING BILAT W/ TOMO W/ CAD
8 series · 9 of 24 positions shown · non-contrast
Comparison: Previous exam(s).

CLINICAL DATA: Screening.

EXAM:
DIGITAL SCREENING BILATERAL MAMMOGRAM WITH TOMO AND CAD

[L MLO synth-2D]
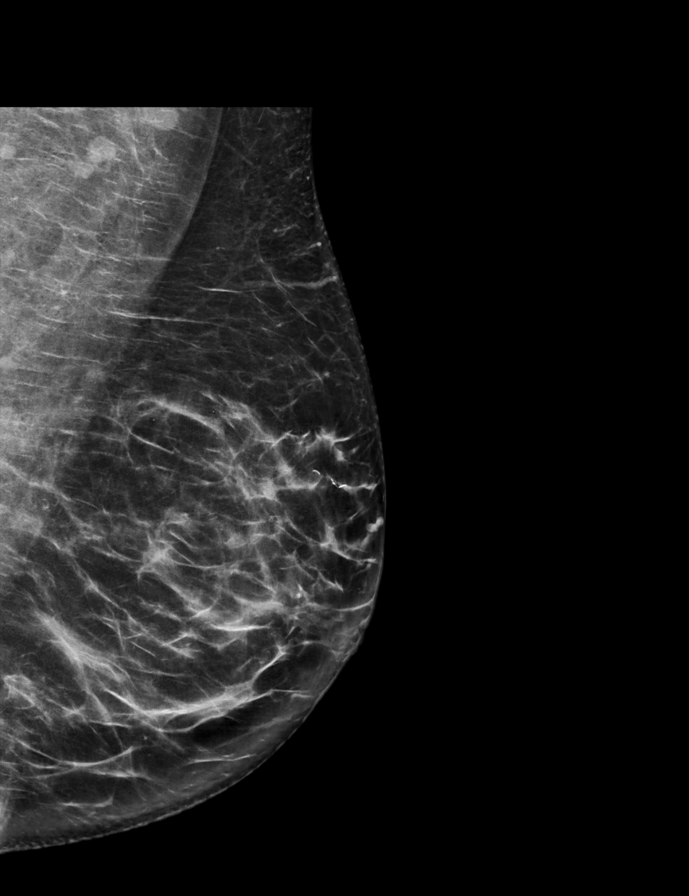

[R CC synth-2D]
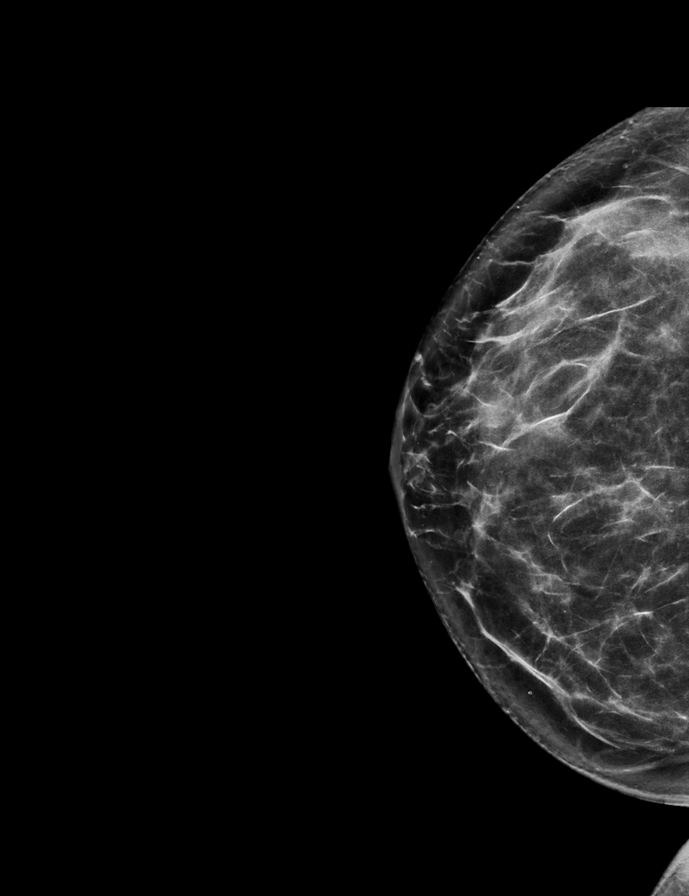

[L CC synth-2D]
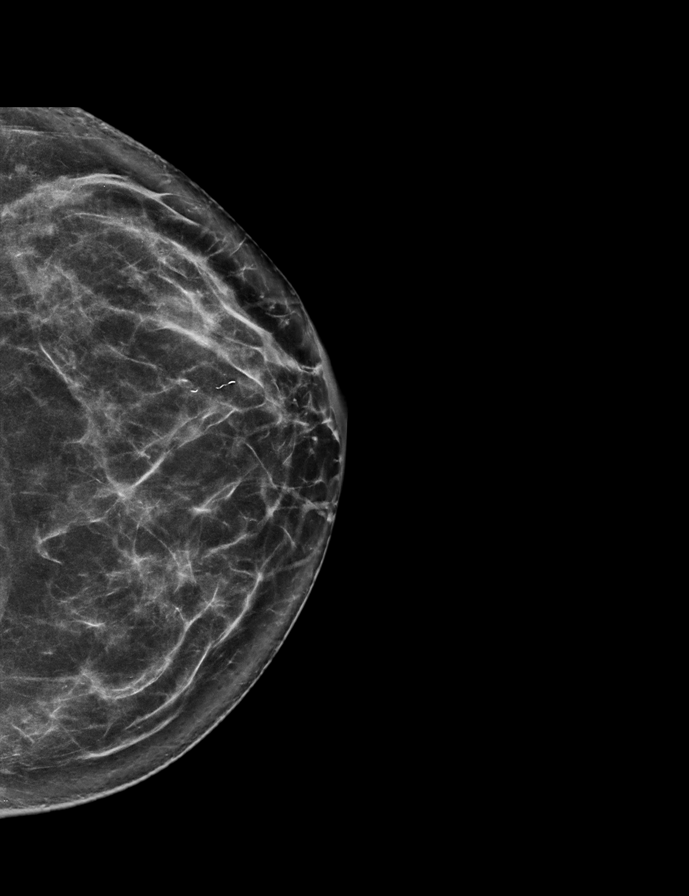

[R MLO synth-2D]
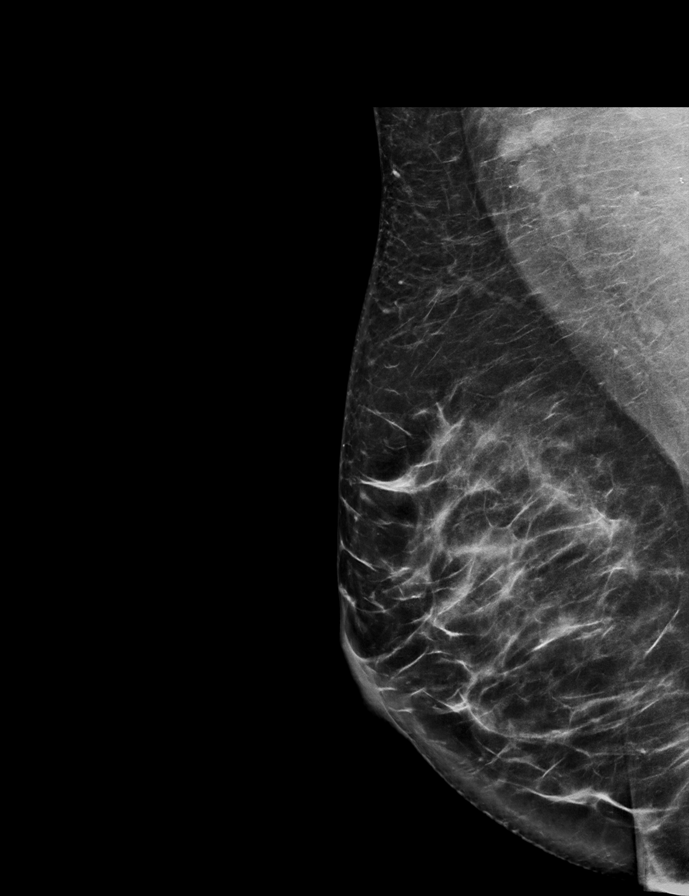

[R MLO tomo · 2 of 80 frames shown]
[frame 26/80]
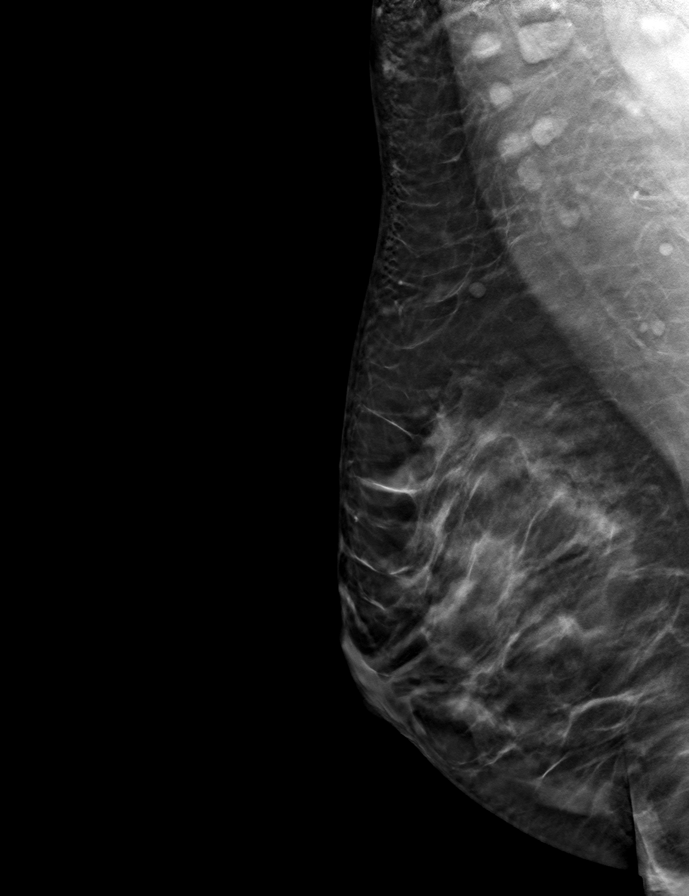
[frame 41/80]
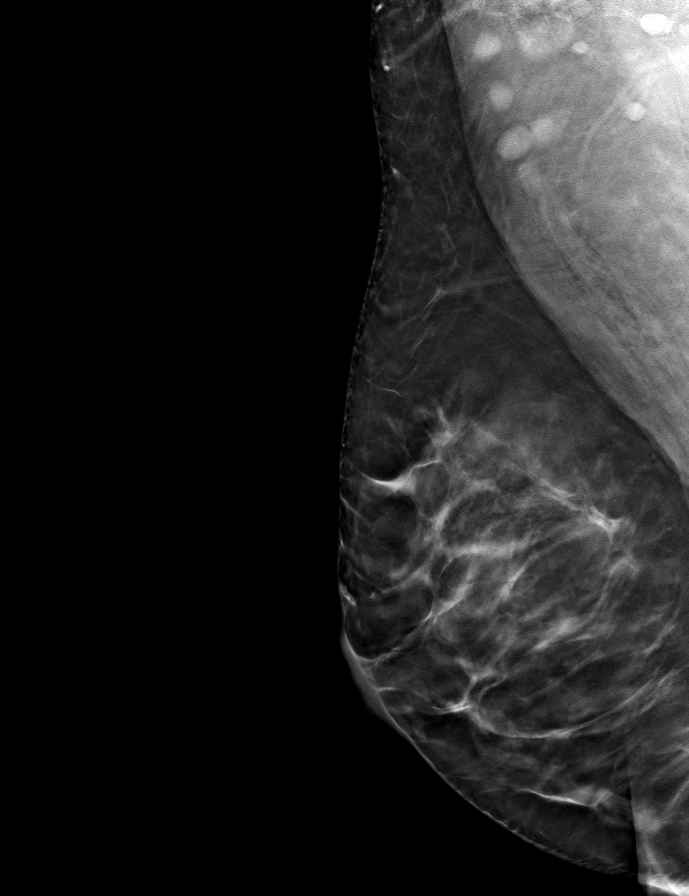

[R CC tomo · tomo slice 35/70.0]
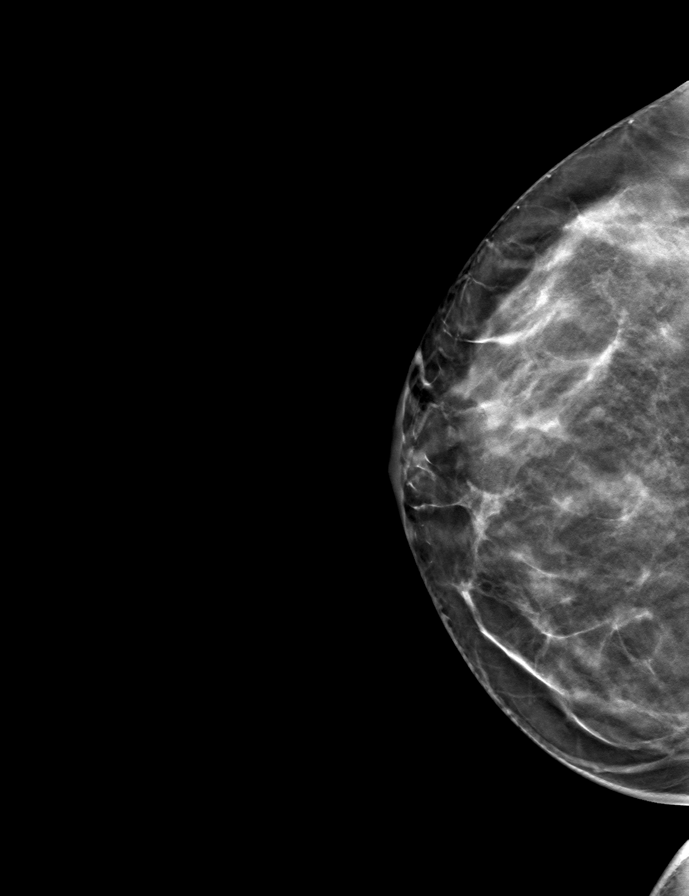

[L MLO tomo · tomo slice 39/78.0]
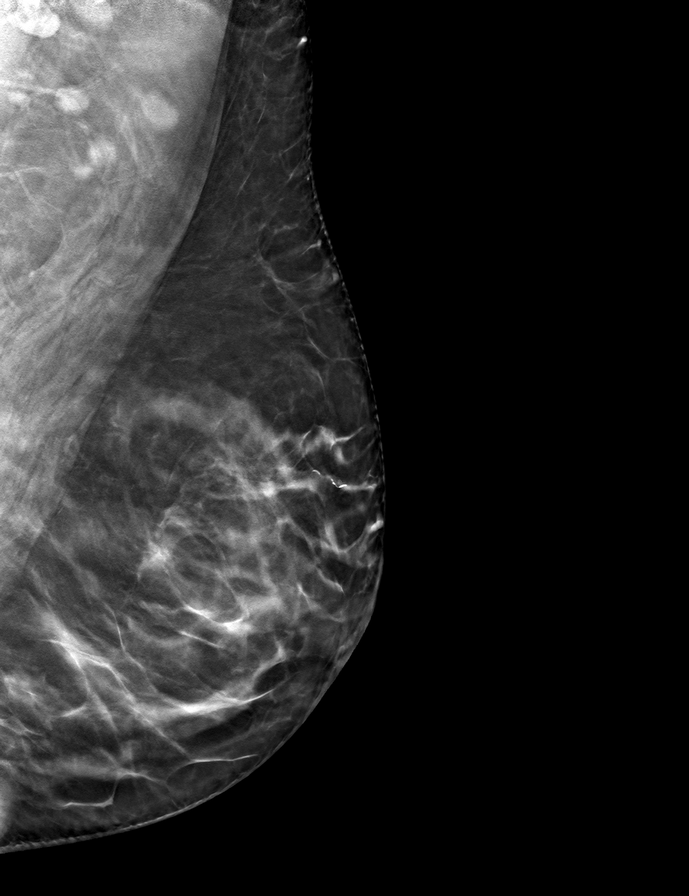

[L CC tomo · tomo slice 37/72.0]
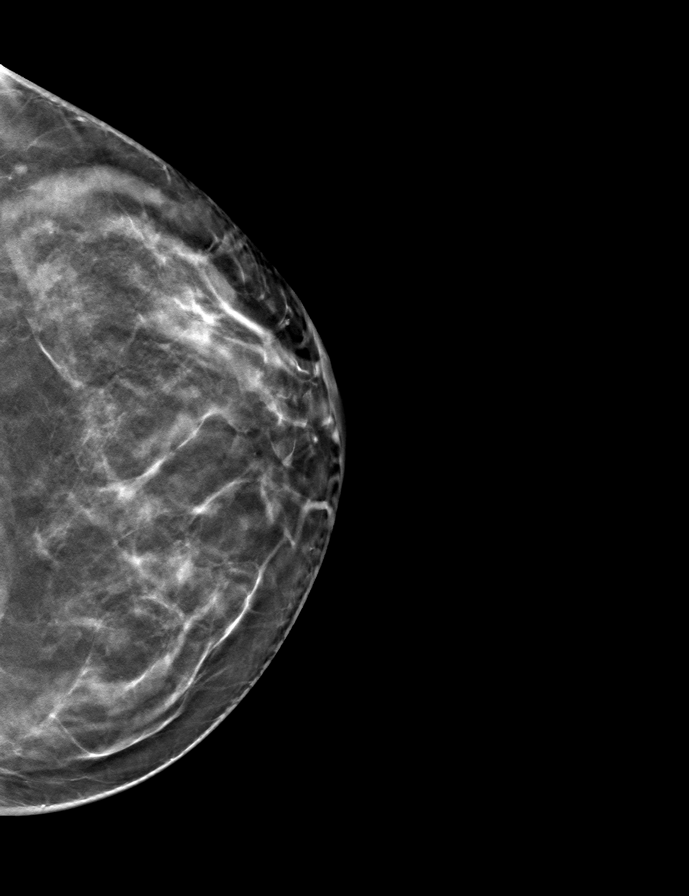

[9 of 24 positions shown; findings below may reference images not displayed]

ACR Breast Density Category c: The breast tissue is heterogeneously
dense, which may obscure small masses.
FINDINGS: There are no findings suspicious for malignancy. Images were
processed with CAD.
IMPRESSION: No mammographic evidence of malignancy. A result letter of this
screening mammogram will be mailed directly to the patient.

RECOMMENDATION:
Screening mammogram in one year. (Code:FT-U-LHB)

BI-RADS CATEGORY  1: Negative.
